# Patient Record
Sex: Female | Born: 2000 | Race: Black or African American | Hispanic: Yes | Marital: Single | Smoking: Never smoker
Health system: Southern US, Community
[De-identification: ages and names within clinical notes are randomized; demographics above are authoritative.]

## PROBLEM LIST (undated history)

## (undated) DIAGNOSIS — A749 Chlamydial infection, unspecified: Secondary | ICD-10-CM

## (undated) DIAGNOSIS — T50902A Poisoning by unspecified drugs, medicaments and biological substances, intentional self-harm, initial encounter: Secondary | ICD-10-CM

## (undated) HISTORY — DX: Chlamydial infection, unspecified: A74.9

## (undated) HISTORY — PX: NO PAST SURGERIES: SHX2092

---
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
## 2021-01-17 DIAGNOSIS — Z419 Encounter for procedure for purposes other than remedying health state, unspecified: Secondary | ICD-10-CM | POA: Diagnosis not present

## 2021-02-17 DIAGNOSIS — Z419 Encounter for procedure for purposes other than remedying health state, unspecified: Secondary | ICD-10-CM | POA: Diagnosis not present

## 2021-03-19 DIAGNOSIS — Z419 Encounter for procedure for purposes other than remedying health state, unspecified: Secondary | ICD-10-CM | POA: Diagnosis not present

## 2021-04-19 DIAGNOSIS — Z419 Encounter for procedure for purposes other than remedying health state, unspecified: Secondary | ICD-10-CM | POA: Diagnosis not present

## 2021-05-11 ENCOUNTER — Ambulatory Visit (INDEPENDENT_AMBULATORY_CARE_PROVIDER_SITE_OTHER): Payer: Medicaid Other | Admitting: Obstetrics and Gynecology

## 2021-05-11 ENCOUNTER — Encounter: Payer: Self-pay | Admitting: Obstetrics and Gynecology

## 2021-05-11 ENCOUNTER — Other Ambulatory Visit (HOSPITAL_COMMUNITY)
Admission: RE | Admit: 2021-05-11 | Discharge: 2021-05-11 | Disposition: A | Discharge status: Home / Self Care | Payer: Medicaid Other | Source: Ambulatory Visit | Attending: Obstetrics and Gynecology | Admitting: Obstetrics and Gynecology

## 2021-05-11 ENCOUNTER — Other Ambulatory Visit: Payer: Self-pay

## 2021-05-11 VITALS — BP 116/76 | HR 84 | Ht 65.0 in | Wt 119.2 lb

## 2021-05-11 DIAGNOSIS — Z3189 Encounter for other procreative management: Secondary | ICD-10-CM | POA: Diagnosis not present

## 2021-05-11 DIAGNOSIS — Z113 Encounter for screening for infections with a predominantly sexual mode of transmission: Secondary | ICD-10-CM | POA: Diagnosis not present

## 2021-05-11 MED ORDER — FOLIC ACID 1 MG PO TABS: 1.0000 mg | ORAL_TABLET | Freq: Every day | ORAL | 10 refills | Status: DC

## 2021-05-11 NOTE — Progress Notes (Signed)
Obstetrics and Gynecology Established Patient Evaluation  Appointment Date: 05/11/2021  OBGYN Clinic: [facility]  Primary Care Provider: Olean Ree B  Referring Provider: Self  Chief Complaint: Fertility questions  History of Present Illness: Hannah York is a 20 y.o. G0 (Patient's last menstrual period was 04/27/2021 (exact date).), seen for the above chief complaint.   Patient had Nexplanon removed in June 2021 and got a depo provera shot at the same time.  She has qmonth, regular periods that last a week and are not heavy or painful. She is interested in getting pregnant soon   Review of Systems: Pertinent items noted in HPI and remainder of comprehensive ROS otherwise negative.    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  Past Obstetrical History:  OB History  Gravida Para Term Preterm AB Living  0 0 0 0 0 0  SAB IAB Ectopic Multiple Live Births  0 0 0 0 0    Past Gynecological History: As per HPI. History of Pap Smear(s): No.  She is currently using no method for contraception.   Social History:  Social History   Socioeconomic History   Marital status: Single    Spouse name: Not on file   Number of children: Not on file   Years of education: Not on file   Highest education level: Not on file  Occupational History   Not on file  Tobacco Use   Smoking status: Never   Smokeless tobacco: Never  Substance and Sexual Activity   Alcohol use: Never   Drug use: Never   Sexual activity: Not Currently    Birth control/protection: Implant  Other Topics Concern   Not on file  Social History  Narrative   2020- student at [employer], wants to attend [employer] or [employer] to study nursing   Lives with her mother, mother's SO, younger brother and sister   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   Physical Exam:  BP 116/76   Pulse 84   Ht 5\' 5"  (1.651 m)   Wt 119 lb 3.2 oz (54.1 kg)   LMP 04/27/2021 (Exact Date)   BMI 19.84 kg/m  Body mass index is 19.84 kg/m. General appearance: Well nourished, well developed female in no acute distress.  Neuro/Psych:  Normal mood and affect.  Skin:  Warm and dry.   Pelvic exam: pt declined  Laboratory: none  Radiology: none  Assessment: pt doing well  Plan:  1. Screen for STD (sexually transmitted disease) Pt declines bloodwork - Cervicovaginal ancillary only  2. Encounter for fertility planning I told her that if she is  having periods then she is likely ovulating and since they are regular and qmonth, she can get pregnant. I told her I recommend taking folic acid (sent in) to decrease risk of NTD.    RTC PRN  Cornelia Copa MD Attending [facility])

## 2021-05-12 LAB — CERVICOVAGINAL ANCILLARY ONLY
Bacterial Vaginitis (gardnerella): POSITIVE — AB
Candida Glabrata: NEGATIVE
Candida Vaginitis: NEGATIVE
Chlamydia: NEGATIVE
Comment: NEGATIVE
Comment: NEGATIVE
Comment: NEGATIVE
Comment: NEGATIVE
Comment: NEGATIVE
Comment: NORMAL
Neisseria Gonorrhea: NEGATIVE
Trichomonas: NEGATIVE

## 2021-05-13 ENCOUNTER — Telehealth: Payer: Self-pay | Admitting: *Deleted

## 2021-05-13 MED ORDER — METRONIDAZOLE 500 MG PO TABS
500.0000 mg | ORAL_TABLET | Freq: Two times a day (BID) | ORAL | 0 refills | Status: AC
Start: 2021-05-13 — End: 2021-05-20

## 2021-05-13 NOTE — Telephone Encounter (Signed)
Pt informed of swab results and medication sent into the pharmacy.

## 2021-05-13 NOTE — Addendum Note (Signed)
Addended by: Donley Bing on: 05/13/2021 08:22 AM   Modules accepted: Orders

## 2021-05-13 NOTE — Telephone Encounter (Signed)
-----   Message from Plato Bing, MD sent at 05/13/2021  8:20 AM EDT ----- Can you let her know I sent in flagyl for her? thanks

## 2021-05-20 DIAGNOSIS — Z419 Encounter for procedure for purposes other than remedying health state, unspecified: Secondary | ICD-10-CM | POA: Diagnosis not present

## 2021-06-19 DIAGNOSIS — Z419 Encounter for procedure for purposes other than remedying health state, unspecified: Secondary | ICD-10-CM | POA: Diagnosis not present

## 2021-07-20 DIAGNOSIS — Z419 Encounter for procedure for purposes other than remedying health state, unspecified: Secondary | ICD-10-CM | POA: Diagnosis not present

## 2021-08-17 ENCOUNTER — Ambulatory Visit (INDEPENDENT_AMBULATORY_CARE_PROVIDER_SITE_OTHER): Payer: Medicaid Other

## 2021-08-17 ENCOUNTER — Other Ambulatory Visit: Payer: Self-pay

## 2021-08-17 ENCOUNTER — Ambulatory Visit (INDEPENDENT_AMBULATORY_CARE_PROVIDER_SITE_OTHER): Payer: Medicaid Other | Admitting: *Deleted

## 2021-08-17 VITALS — BP 123/82 | HR 73 | Wt 117.0 lb

## 2021-08-17 DIAGNOSIS — Z3A01 Less than 8 weeks gestation of pregnancy: Secondary | ICD-10-CM | POA: Diagnosis not present

## 2021-08-17 DIAGNOSIS — O3680X0 Pregnancy with inconclusive fetal viability, not applicable or unspecified: Secondary | ICD-10-CM

## 2021-08-17 DIAGNOSIS — Z3401 Encounter for supervision of normal first pregnancy, first trimester: Secondary | ICD-10-CM | POA: Diagnosis not present

## 2021-08-17 MED ORDER — DICLEGIS 10-10 MG PO TBEC: 2.0000 | DELAYED_RELEASE_TABLET | Freq: Every day | ORAL | 5 refills | Status: DC

## 2021-08-17 NOTE — Progress Notes (Signed)
Patient was assessed and managed by nursing staff during this encounter. I have reviewed the chart and agree with the documentation and plan.   Jaynie Collins, MD 08/17/2021 3:18 PM

## 2021-08-17 NOTE — Progress Notes (Signed)
New OB Intake  I explained I am completing New OB Intake today. We discussed her EDD of 03/30/2022 that is based on LMP of 06/23/2021. Pt is G1/P0. I reviewed her allergies, medications, Medical/Surgical/OB history, and appropriate screenings.   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   MyChart/Babyscripts MyChart access verified. I explained pt will have some visits in office and some virtually. Babyscripts instructions given and order placed. Patient verifies receipt of registration text/e-mail. Account successfully created and app downloaded.  Blood Pressure Cuff  Pt given BP cuff from office     Weight scale: Patient    have weight scale. Weight scale ordered for patient to pick up form [facility].   Anatomy US Explained first scheduled [place] will be around 19 weeks.   Labs Discussed Avelina Laine genetic screening with patient. Will think about both Panorama and Horizon. Routine prenatal labs needed.  Covid Vaccine Patient has covid vaccine.   Informed patient of [facility] website  and placed link in her AVS.   Send link to Pregnancy Navigators   Placed OB Box on problem list and updated   Patient informed that the ultrasound is considered a limited obstetric ultrasound and is not intended to be a complete ultrasound exam.  Patient also informed that the ultrasound is not being completed with the intent of assessing for fetal or placental anomalies or any pelvic abnormalities. Explained that the purpose of today's ultrasound is to assess for dating and fetal heart rate.  Patient acknowledges the purpose of the exam and the limitations of the study.      First visit review I reviewed new OB appt with pt. I  explained she will ob bloodwork with possible genetic screening. Explained pt will be seen by Thalia Bloodgood, CNM at first visit; encounter routed to appropriate provider. Explained that patient will be seen by pregnancy navigator following visit with provider.     Scheryl Marten, RN 08/17/2021  2:55 PM

## 2021-08-17 NOTE — Addendum Note (Signed)
Addended by: Scheryl Marten on: 08/17/2021 03:37 PM   Modules accepted: Orders

## 2021-08-19 DIAGNOSIS — Z419 Encounter for procedure for purposes other than remedying health state, unspecified: Secondary | ICD-10-CM | POA: Diagnosis not present

## 2021-09-16 ENCOUNTER — Ambulatory Visit (INDEPENDENT_AMBULATORY_CARE_PROVIDER_SITE_OTHER): Payer: Medicaid Other | Admitting: Advanced Practice Midwife

## 2021-09-16 ENCOUNTER — Other Ambulatory Visit: Payer: Self-pay

## 2021-09-16 ENCOUNTER — Other Ambulatory Visit (HOSPITAL_COMMUNITY)
Admission: RE | Admit: 2021-09-16 | Discharge: 2021-09-16 | Disposition: A | Discharge status: Home / Self Care | Payer: Medicaid Other | Source: Ambulatory Visit | Attending: Advanced Practice Midwife | Admitting: Advanced Practice Midwife

## 2021-09-16 ENCOUNTER — Encounter: Payer: Self-pay | Admitting: Advanced Practice Midwife

## 2021-09-16 VITALS — BP 117/79 | HR 103 | Wt 117.6 lb

## 2021-09-16 DIAGNOSIS — Z3481 Encounter for supervision of other normal pregnancy, first trimester: Secondary | ICD-10-CM | POA: Diagnosis not present

## 2021-09-16 DIAGNOSIS — Z23 Encounter for immunization: Secondary | ICD-10-CM

## 2021-09-16 DIAGNOSIS — F332 Major depressive disorder, recurrent severe without psychotic features: Secondary | ICD-10-CM

## 2021-09-16 DIAGNOSIS — O219 Vomiting of pregnancy, unspecified: Secondary | ICD-10-CM | POA: Diagnosis not present

## 2021-09-16 DIAGNOSIS — Z3490 Encounter for supervision of normal pregnancy, unspecified, unspecified trimester: Secondary | ICD-10-CM | POA: Insufficient documentation

## 2021-09-16 DIAGNOSIS — Z3A12 12 weeks gestation of pregnancy: Secondary | ICD-10-CM

## 2021-09-16 DIAGNOSIS — Z3401 Encounter for supervision of normal first pregnancy, first trimester: Secondary | ICD-10-CM

## 2021-09-16 MED ORDER — ONDANSETRON 4 MG PO TBDP: 4.0000 mg | ORAL_TABLET | Freq: Four times a day (QID) | ORAL | 0 refills | Status: DC | PRN

## 2021-09-16 MED ORDER — ASPIRIN EC 81 MG PO TBEC: 81.0000 mg | DELAYED_RELEASE_TABLET | Freq: Every day | ORAL | 2 refills | Status: DC

## 2021-09-16 MED ORDER — PROMETHAZINE HCL 12.5 MG PO TABS: 12.5000 mg | ORAL_TABLET | Freq: Four times a day (QID) | ORAL | 0 refills | Status: DC | PRN

## 2021-09-16 NOTE — Patient Instructions (Signed)

## 2021-09-16 NOTE — Progress Notes (Signed)
History:   Hannah York is a 20 y.o. G1P0000 at [redacted]w[redacted]d by early ultrasound being seen today for her first obstetrical visit.  Her obstetrical history is significant for  teen pregnancy . Patient does intend to breast feed. Pregnancy history fully reviewed.  Patient reports  nausea and vomiting . She has attempted management with Diclegis but is not experiencing relief.   Patient is accompanied by partner [facility]. They are both very excited.      HISTORY: OB History  Gravida Para Term Preterm AB Living  1 0 0 0 0 0  SAB IAB Ectopic Multiple Live Births  0 0 0 0 0    # Outcome Date GA Lbr Len/2nd Weight Sex Delivery Anes PTL Lv  1 Current             No pap history, age 64  Past Medical History:  Diagnosis Date   Chlamydia    No past surgical history on file. Family History  Problem Relation Age of Onset   Asthma Brother    Diabetes Maternal Grandmother    Hypertension Maternal Grandmother    Social History   Tobacco Use   Smoking status: Never   Smokeless tobacco: Never  Vaping Use   Vaping Use: Never used  Substance Use Topics   Alcohol use: Never   Drug use: Never   No Known Allergies Current Outpatient Medications on File Prior to Visit  Medication Sig Dispense Refill   DICLEGIS 10-10 MG TBEC Take 2 tablets by mouth at bedtime. If symptoms persist, add one tablet in the morning and one in the afternoon 100 tablet 5   folic acid (FOLVITE) 1 MG tablet Take 1 tablet (1 mg total) by mouth daily. 30 tablet 10   No current facility-administered medications on file prior to visit.   Indications for ASA therapy (per uptodate)  Two or more of the following: Nulliparity Yes Obesity (body mass index >30 kg/m2) No Family history of preeclampsia in mother or sister No Age ?35 years No Sociodemographic characteristics (African American race, low socioeconomic level) Yes Personal risk factors (eg, previous pregnancy with low birth weight or small for gestational  age infant, previous adverse pregnancy outcome [eg, stillbirth], interval >10 years between pregnancies) No   Review of Systems Pertinent items noted in HPI and remainder of comprehensive ROS otherwise negative.  Physical Exam:   Vitals:   09/16/21 1553  BP: 117/79  Pulse: (!) 103  Weight: 117 lb 9.6 oz (53.3 kg)   Fetal Heart Rate (bpm): 120  Uterine size:   Bedside Ultrasound for FHR check: Viable intrauterine pregnancy with positive cardiac activity noted, fetal heart rate 120 bpm Patient informed that the ultrasound is considered a limited obstetric ultrasound and is not intended to be a complete ultrasound exam.  Patient also informed that the ultrasound is not being completed with the intent of assessing for fetal or placental anomalies or any pelvic abnormalities.  Explained that the purpose of todays ultrasound is to assess for fetal heart rate.  Patient acknowledges the purpose of the exam and the limitations of the study. General: well-developed, well-nourished female in no acute distress  Skin: normal coloration and turgor, no rashes  Neurologic: oriented, normal, negative, normal mood  Extremities: normal strength, tone, and muscle mass, ROM of all joints is normal  HEENT PERRLA, extraocular movement intact and sclera clear, anicteric  Neck supple and no masses  Cardiovascular: regular rate and rhythm  Respiratory:  no respiratory distress, normal  effort  Abdomen: soft, non-tender; bowel sounds normal; no masses,  no organomegaly    Assessment:    Pregnancy: G1P0000 Patient Active Problem List   Diagnosis Date Noted   Supervision of normal pregnancy 09/16/2021   Major depressive disorder, recurrent severe without psychotic features (HCC) 09/22/2019   Intentional drug overdose (HCC)    Inattention 08/27/2019   Hyperactivity 08/27/2019     Plan:    1. Supervision of normal first teen pregnancy in first trimester - Welcomed to practice!  -  CBC/D/Plt+RPR+Rh+ABO+RubIgG... - Culture, OB Urine - Cervicovaginal ancillary only( [place]) - Genetic Screening  2. Nausea and vomiting during pregnancy prior to [redacted] weeks gestation  - Revised regimen to include Zofran and Phenergan as needed. Discussed previous research Zofran and cleft palate risk, no longer considered accurate but merits discussion with patient.  3. Flu vaccine need  - Flu Vaccine QUAD 64mo+IM (Fluarix, Fluzone & Alfiuria Quad PF)  4. Major depressive disorder, recurrent severe without psychotic features (HCC) - Stable, will discuss referral to [facility] next visit   Initial labs drawn. Continue prenatal vitamins. Problem list reviewed and updated. Genetic Screening discussed, First trimester screen, Quad screen, and NIPS: ordered. Ultrasound discussed; fetal anatomic survey: ordered. Anticipatory guidance about prenatal visits given including labs, ultrasounds, and testing. Discussed usage of Babyscripts and virtual visits as additional source of managing and completing prenatal visits in midst of coronavirus and pandemic.   Encouraged to complete MyChart Registration for her ability to review results, send requests, and have questions addressed.  The nature of [place] [facility] with multiple MDs and Advanced Practice Providers was explained to patient; also emphasized that residents, students are part of our team. Routine obstetric precautions reviewed. Encouraged to seek out care at office or emergency room [facility] MAU preferred) for urgent and/or emergent concerns. Return in about 4 weeks (around 10/14/2021) for APP please.    Clayton Bibles, MSA, MSN, CNM Certified Nurse Midwife, Biochemist, clinical for [employer], [facility]

## 2021-09-17 LAB — CBC/D/PLT+RPR+RH+ABO+RUBIGG...
Antibody Screen: NEGATIVE
Basophils Absolute: 0 10*3/uL (ref 0.0–0.2)
Basos: 0 %
EOS (ABSOLUTE): 0.1 10*3/uL (ref 0.0–0.4)
Eos: 1 %
HCV Ab: <0.1 s/co ratio (ref 0.0–0.9)
HIV Screen 4th Generation wRfx: NONREACTIVE
Hematocrit: 36.9 % (ref 34.0–46.6)
Hemoglobin: 12.5 g/dL (ref 11.1–15.9)
Hepatitis B Surface Ag: NEGATIVE
Immature Grans (Abs): 0 10*3/uL (ref 0.0–0.1)
Immature Granulocytes: 0 %
Lymphocytes Absolute: 2.1 10*3/uL (ref 0.7–3.1)
Lymphs: 24 %
MCH: 29.3 pg (ref 26.6–33.0)
MCHC: 33.9 g/dL (ref 31.5–35.7)
MCV: 87 fL (ref 79–97)
Monocytes Absolute: 0.7 10*3/uL (ref 0.1–0.9)
Monocytes: 8 %
Neutrophils Absolute: 5.8 10*3/uL (ref 1.4–7.0)
Neutrophils: 67 %
Platelets: 201 10*3/uL (ref 150–450)
RBC: 4.26 x10E6/uL (ref 3.77–5.28)
RDW: 12.7 % (ref 11.7–15.4)
RPR Ser Ql: NONREACTIVE
Rh Factor: POSITIVE
Rubella Antibodies, IGG: 1.09 index (ref >0.99)
WBC: 8.8 10*3/uL (ref 3.4–10.8)

## 2021-09-17 LAB — HCV INTERPRETATION

## 2021-09-18 LAB — URINE CULTURE, OB REFLEX

## 2021-09-18 LAB — CULTURE, OB URINE

## 2021-09-19 DIAGNOSIS — Z419 Encounter for procedure for purposes other than remedying health state, unspecified: Secondary | ICD-10-CM | POA: Diagnosis not present

## 2021-09-19 NOTE — L&D Delivery Note (Signed)
OB/GYN Faculty Practice Delivery Note  Hannah York is a 21 y.o. G1P0000 s/p SVD at 101w2d. She was admitted for spontaneous onset of labor.   ROM: 3h 24m with clear fluid GBS Status: Negative Maximum Maternal Temperature: 98.6  Labor Progress: Arrived to L&D at 4cm and progressed to 5cm for AROM. Progressed quickly to 8cm with urge to push, got epidural and labored easily to complete. Pushed for over an hour with slow progress so 33mu/min of pitocin started.   Delivery Date/Time: 03/25/22 at 2008 Delivery:  Head delivered LOP. No nuchal cord present. Shoulder and body delivered in usual fashion. Infant with spontaneous cry, placed on mother's abdomen, dried and stimulated. Cord clamped x 2 after 5-minute delay, and cut by FOB. Cord blood drawn and cord sample collected. Placenta delivered spontaneously, intact, with 3-vessel cord. Fundus firm with massage and Pitocin. Labia, perineum, vagina, and cervix inspected, no laceration found.  Placenta: Intact, spontaneous, sent to L&D for disposal Complications: None Lacerations: None EBL: 50 Analgesia: Epidural  Postpartum Planning [x]  transfer orders to MB [x]  discharge summary started & shared [x]  message to sent to schedule follow-up  [x]  lists updated [x]  vaccines UTD  Infant: Girl  APGARs 9/9  pending  , CNM, IBCLC Certified Nurse Midwife, [facility] for , [facility] 03/25/2022, 8:30 PM

## 2021-09-21 LAB — CERVICOVAGINAL ANCILLARY ONLY
Chlamydia: NEGATIVE
Comment: NEGATIVE
Comment: NORMAL
Neisseria Gonorrhea: NEGATIVE

## 2021-09-27 ENCOUNTER — Telehealth: Payer: Self-pay

## 2021-09-27 NOTE — Telephone Encounter (Signed)
TC to pt to make aware of both Genetic Screening results. Both Horizon/Panorama negative. Pt does not want to know gender of baby at this time.

## 2021-10-12 ENCOUNTER — Encounter: Payer: Self-pay | Admitting: Advanced Practice Midwife

## 2021-10-13 ENCOUNTER — Encounter: Payer: Self-pay | Admitting: *Deleted

## 2021-10-19 ENCOUNTER — Other Ambulatory Visit: Payer: Self-pay

## 2021-10-19 ENCOUNTER — Telehealth (INDEPENDENT_AMBULATORY_CARE_PROVIDER_SITE_OTHER): Payer: Medicaid Other | Admitting: Obstetrics & Gynecology

## 2021-10-19 ENCOUNTER — Encounter: Payer: Self-pay | Admitting: Obstetrics & Gynecology

## 2021-10-19 VITALS — BP 106/66 | HR 100

## 2021-10-19 DIAGNOSIS — Z3492 Encounter for supervision of normal pregnancy, unspecified, second trimester: Secondary | ICD-10-CM

## 2021-10-19 DIAGNOSIS — Z3A16 16 weeks gestation of pregnancy: Secondary | ICD-10-CM

## 2021-10-19 DIAGNOSIS — Z3490 Encounter for supervision of normal pregnancy, unspecified, unspecified trimester: Secondary | ICD-10-CM

## 2021-10-19 NOTE — Progress Notes (Signed)
° °  OBSTETRICS PRENATAL VIRTUAL VISIT ENCOUNTER NOTE  Provider location: Center for [place] [facility]   Patient location: Home  I connected with Hannah York on 10/19/21 at  3:30 PM EST by MyChart Video Encounter and verified that I am speaking with the correct person using two identifiers. I discussed the limitations, risks, security and privacy concerns of performing an evaluation and management service virtually and the availability of in person appointments. I also discussed with the patient that there may be a patient responsible charge related to this service. The patient expressed understanding and agreed to proceed. Subjective:  Hannah York is a 21 y.o. G1P0000 at [redacted]w[redacted]d being seen today for ongoing prenatal care.  She is currently monitored for the following issues for this low-risk pregnancy and has Inattention; Hyperactivity; Major depressive disorder, recurrent severe without psychotic features ([place]); Intentional drug overdose ([place]); and Supervision of normal pregnancy on their problem list.  Patient reports no complaints.  Contractions: Not present. Vag. Bleeding: None.   . Denies any leaking of fluid.   The following portions of the patient's history were reviewed and updated as appropriate: allergies, current medications, past family history, past medical history, past social history, past surgical history and problem list.   Objective:   Vitals:   10/19/21 1505  BP: 106/66  Pulse: 100    Fetal Status:           General:  Alert, oriented and cooperative. Patient is in no acute distress.  Respiratory: Normal respiratory effort, no problems with respiration noted  Mental Status: Normal mood and affect. Normal behavior. Normal judgment and thought content.  Rest of physical exam deferred due to type of encounter  Imaging: No results found.  Assessment and Plan:  Pregnancy: G1P0000 at [redacted]w[redacted]d 1. [redacted] weeks gestation of pregnancy 2. Encounter for  supervision of normal pregnancy, antepartum Low risk NIPS. Anatomy scan to be schedule. Work restrictions letter given to patient as per her request. - [place] MFM OB COMP + 63 WK; Future No other complaints or concerns.  Routine obstetric precautions reviewed.  I discussed the assessment and treatment plan with the patient. The patient was provided an opportunity to ask questions and all were answered. The patient agreed with the plan and demonstrated an understanding of the instructions. The patient was advised to call back or seek an in-person office evaluation/go to MAU at [facility] for any urgent or concerning symptoms. Please refer to After Visit Summary for other counseling recommendations.   I provided 10 minutes of face-to-face time during this encounter.  Return in about 23 days (around 11/11/2021) for OB visits as scheduled.  Future Appointments  Date Time Provider [place]  10/19/2021  3:30 PM Jennavecia Schwier, Sallyanne Havers, MD [facility]  11/11/2021  2:30 PM Darlina Rumpf, CNM [facility]    Verita Schneiders, MD Center for [facility] [place]

## 2021-10-20 DIAGNOSIS — Z419 Encounter for procedure for purposes other than remedying health state, unspecified: Secondary | ICD-10-CM | POA: Diagnosis not present

## 2021-11-11 ENCOUNTER — Other Ambulatory Visit: Payer: Self-pay

## 2021-11-11 ENCOUNTER — Ambulatory Visit (INDEPENDENT_AMBULATORY_CARE_PROVIDER_SITE_OTHER): Payer: Medicaid Other | Admitting: Advanced Practice Midwife

## 2021-11-11 VITALS — BP 112/75 | HR 76 | Wt 126.0 lb

## 2021-11-11 DIAGNOSIS — Z3401 Encounter for supervision of normal first pregnancy, first trimester: Secondary | ICD-10-CM

## 2021-11-11 DIAGNOSIS — Z3A20 20 weeks gestation of pregnancy: Secondary | ICD-10-CM

## 2021-11-11 NOTE — Progress Notes (Signed)
° °  PRENATAL VISIT NOTE  Subjective:  Hannah York is a 21 y.o. G1P0000 at [redacted]w[redacted]d being seen today for ongoing prenatal care.  She is currently monitored for the following issues for this low-risk pregnancy and has Inattention; Hyperactivity; Major depressive disorder, recurrent severe without psychotic features (HCC); Intentional drug overdose (HCC); and Supervision of normal pregnancy on their problem list.  Patient reports no complaints.  Contractions: Not present. Vag. Bleeding: None.  Movement: Present. Denies leaking of fluid.   The following portions of the patient's history were reviewed and updated as appropriate: allergies, current medications, past family history, past medical history, past social history, past surgical history and problem list. Problem list updated.  Objective:   Vitals:   11/11/21 1441  BP: 112/75  Pulse: 76  Weight: 126 lb (57.2 kg)    Fetal Status: Fetal Heart Rate (bpm): 143   Movement: Present     General:  Alert, oriented and cooperative. Patient is in no acute distress.  Skin: Skin is warm and dry. No rash noted.   Cardiovascular: Normal heart rate noted  Respiratory: Normal respiratory effort, no problems with respiration noted  Abdomen: Soft, gravid, appropriate for gestational age.  Pain/Pressure: Absent     Pelvic: Cervical exam deferred        Extremities: Normal range of motion.     Mental Status: Normal mood and affect. Normal behavior. Normal judgment and thought content.   Assessment and Plan:  Pregnancy: G1P0000 at [redacted]w[redacted]d  1. Supervision of normal first teen pregnancy in first trimester - Routine care, discussed typical physiologic changes in second trimester - AFP, Serum, Open Spina Bifida  2. [redacted] weeks gestation of pregnancy   Preterm labor symptoms and general obstetric precautions including but not limited to vaginal bleeding, contractions, leaking of fluid and fetal movement were reviewed in detail with the patient. Please  refer to After Visit Summary for other counseling recommendations.    Future Appointments  Date Time Provider Department Center  11/18/2021  1:00 PM [facility]  12/07/2021  2:30 PM Anyanwu, Jethro Bastos, MD [facility]  01/06/2022  8:35 AM Calvert Cantor, CNM [facility]    Calvert Cantor, CNM

## 2021-11-13 LAB — AFP, SERUM, OPEN SPINA BIFIDA
AFP MoM: 0.85
AFP Value: 63 ng/mL
Gest. Age on Collection Date: 20.1 weeks
Maternal Age At EDD: 20.1 yr
OSBR Risk 1 IN: 10000
Test Results:: NEGATIVE
Weight: 126 [lb_av]

## 2021-11-16 ENCOUNTER — Telehealth: Payer: Medicaid Other | Admitting: Obstetrics & Gynecology

## 2021-11-17 DIAGNOSIS — Z419 Encounter for procedure for purposes other than remedying health state, unspecified: Secondary | ICD-10-CM | POA: Diagnosis not present

## 2021-11-18 ENCOUNTER — Ambulatory Visit: Payer: Medicaid Other | Attending: Obstetrics

## 2021-11-18 ENCOUNTER — Other Ambulatory Visit: Payer: Self-pay

## 2021-11-18 DIAGNOSIS — Z363 Encounter for antenatal screening for malformations: Secondary | ICD-10-CM | POA: Insufficient documentation

## 2021-11-18 DIAGNOSIS — Z3A20 20 weeks gestation of pregnancy: Secondary | ICD-10-CM | POA: Insufficient documentation

## 2021-11-18 DIAGNOSIS — Z3A16 16 weeks gestation of pregnancy: Secondary | ICD-10-CM

## 2021-11-18 DIAGNOSIS — Z3490 Encounter for supervision of normal pregnancy, unspecified, unspecified trimester: Secondary | ICD-10-CM

## 2021-11-30 ENCOUNTER — Encounter: Payer: Self-pay | Admitting: Obstetrics & Gynecology

## 2021-12-01 ENCOUNTER — Encounter: Payer: Self-pay | Admitting: Advanced Practice Midwife

## 2021-12-07 ENCOUNTER — Other Ambulatory Visit: Payer: Self-pay

## 2021-12-07 ENCOUNTER — Ambulatory Visit (INDEPENDENT_AMBULATORY_CARE_PROVIDER_SITE_OTHER): Payer: Medicaid Other | Admitting: Obstetrics & Gynecology

## 2021-12-07 ENCOUNTER — Encounter: Payer: Self-pay | Admitting: Obstetrics & Gynecology

## 2021-12-07 VITALS — BP 112/74 | HR 93 | Wt 132.0 lb

## 2021-12-07 DIAGNOSIS — Z3402 Encounter for supervision of normal first pregnancy, second trimester: Secondary | ICD-10-CM

## 2021-12-07 DIAGNOSIS — M549 Dorsalgia, unspecified: Secondary | ICD-10-CM

## 2021-12-07 DIAGNOSIS — O99891 Other specified diseases and conditions complicating pregnancy: Secondary | ICD-10-CM

## 2021-12-07 DIAGNOSIS — Z3A23 23 weeks gestation of pregnancy: Secondary | ICD-10-CM

## 2021-12-07 NOTE — Progress Notes (Signed)
? ?  PRENATAL VISIT NOTE ? ?Subjective:  ?Hannah York is a 21 y.o. G1P0000 at [redacted]w[redacted]d being seen today for ongoing prenatal care.  She is currently monitored for the following issues for this low-risk pregnancy and has Inattention; Hyperactivity; Major depressive disorder, recurrent severe without psychotic features (HCC); Intentional drug overdose (HCC); and Supervision of normal pregnancy on their problem list. ? ?Patient reports  occasional low back pain .  Contractions: Not present. Vag. Bleeding: None.  Movement: Present. Denies leaking of fluid.  ? ?The following portions of the patient's history were reviewed and updated as appropriate: allergies, current medications, past family history, past medical history, past social history, past surgical history and problem list.  ? ?Objective:  ? ?Vitals:  ? 12/07/21 1437  ?BP: 112/74  ?Pulse: 93  ?Weight: 132 lb (59.9 kg)  ? ? ?Fetal Status: Fetal Heart Rate (bpm): 145 Fundal Height: 23 cm Movement: Present    ? ?General:  Alert, oriented and cooperative. Patient is in no acute distress.  ?Skin: Skin is warm and dry. No rash noted.   ?Cardiovascular: Normal heart rate noted  ?Respiratory: Normal respiratory effort, no problems with respiration noted  ?Abdomen: Soft, gravid, appropriate for gestational age.  Pain/Pressure: Absent     ?Pelvic: Cervical exam deferred        ?Extremities: Normal range of motion.  Edema: None  ?Mental Status: Normal mood and affect. Normal behavior. Normal judgment and thought content.  ? ?Assessment and Plan:  ?Pregnancy: G1P0000 at [redacted]w[redacted]d ?1. Back pain affecting pregnancy in second trimester ?Declines medications, doing exercises. Told to consider chiropractor services. ?Handout about exercises that can help given to patient. ?Work accommodations paperwork given to patient. ? ?2. [redacted] weeks gestation of pregnancy ?3. Encounter for supervision of normal first pregnancy in second trimester ?Preterm labor symptoms and general obstetric  precautions including but not limited to vaginal bleeding, contractions, leaking of fluid and fetal movement were reviewed in detail with the patient. ?Please refer to After Visit Summary for other counseling recommendations.  ? ?Return in about 4 weeks (around 01/04/2022) for 2 hr GTT, 3rd trimester labs, TDap, OFFICE OB VISIT (MD or APP). ? ?Future Appointments  ?Date Time Provider Department Center  ?01/06/2022  8:35 AM Calvert Cantor, CNM [facility]  ? ? ?Jaynie Collins, MD ? ?

## 2021-12-07 NOTE — Patient Instructions (Signed)
Nothing to eat or drink after midnight before your next appointment, just sips of plain water. ? ?TDaP Vaccine Pregnancy ?Get the Whooping Cough Vaccine While You Are Pregnant (CDC) ? ?It is important for women to get the whooping cough vaccine in the third trimester of each pregnancy. Vaccines are the best way to prevent this disease. There are 2 different whooping cough vaccines. Both vaccines combine protection against whooping cough, tetanus and diphtheria, but they are for different age groups: ?Tdap: for everyone 11 years or older, including pregnant women  ?DTaP: for children 2 months through 67 years of age ? ?You need the whooping cough vaccine during each of your pregnancies ?The recommended time to get the shot is during your 27th through 36th week of pregnancy, preferably during the earlier part of this time period. ?The Centers for Disease Control and Prevention (CDC) recommends that pregnant women receive the whooping cough vaccine for adolescents and adults (called Tdap vaccine) during the third trimester of each pregnancy. The recommended time to get the shot is during your 27th through 36th week of pregnancy, preferably during the earlier part of this time period. This replaces the original recommendation that pregnant women get the vaccine only if they had not previously received it. ?The [employer] of Obstetricians and Gynecologists and the [employer] support this recommendation. ? ?You should get the whooping cough vaccine while pregnant to pass protection to your baby ?frame support disabled and/or not supported in this browser  ?Learn why Mickel Baas decided to get the whooping cough vaccine in her 3rd trimester of pregnancy and how her baby girl was born with some protection against the disease. ?Also available on YouTube. ?After receiving the whooping cough vaccine, your body will create protective antibodies (proteins produced by the body to fight off diseases) and  pass some of them to your baby before birth. These antibodies provide your baby some short-term protection against whooping cough in early life. These antibodies can also protect your baby from some of the more serious complications that come along with whooping cough. ?Your protective antibodies are at their highest about 2 weeks after getting the vaccine, but it takes time to pass them to your baby. So the preferred time to get the whooping cough vaccine is early in your third trimester. ?The amount of whooping cough antibodies in your body decreases over time. That is why CDC recommends you get a whooping cough vaccine during each pregnancy. Doing so allows each of your babies to get the greatest number of protective antibodies from you. This means each of your babies will get the best protection possible against this disease. ? ?Getting the whooping cough vaccine while pregnant is better than getting the vaccine after you give birth ?Whooping cough vaccination during pregnancy is ideal so your baby will have short-term protection as soon as he is born. This early protection is important because your baby will not start getting his whooping cough vaccines until he is 2 months old. These first few months of life are when your baby is at greatest risk for catching whooping cough. This is also when he?s at greatest risk for having severe, potentially life-threating complications from the infection. To avoid that gap in protection, it is best to get a whooping cough vaccine during pregnancy. You will then pass protection to your baby before he is born. To continue protecting your baby, he should get whooping cough vaccines starting at 2 months old. ?You may never have gotten the  Tdap vaccine before and did not get it during this pregnancy. If so, you should make sure to get the vaccine immediately after you give birth, before leaving the hospital or birthing center. It will take about 2 weeks before your body  develops protection (antibodies) in response to the vaccine. Once you have protection from the vaccine, you are less likely to give whooping cough to your newborn while caring for him. But remember, your baby will still be at risk for catching whooping cough from others. ?A recent study looked to see how effective Tdap was at preventing whooping cough in babies whose mothers got the vaccine while pregnant or in the hospital after giving birth. The study found that getting Tdap between 27 through 36 weeks of pregnancy is 85% more effective at preventing whooping cough in babies younger than 2 months old. ?Blood tests cannot tell if you need a whooping cough vaccine ?There are no blood tests that can tell you if you have enough antibodies in your body to protect yourself or your baby against whooping cough. Even if you have been sick with whooping cough in the past or previously received the vaccine, you still should get the vaccine during each pregnancy. ?Breastfeeding may pass some protective antibodies onto your baby ?By breastfeeding, you may pass some antibodies you have made in response to the vaccine to your baby. When you get a whooping cough vaccine during your pregnancy, you will have antibodies in your breast milk that you can share with your baby as soon as your milk comes in. However, your baby will not get protective antibodies immediately if you wait to get the whooping cough vaccine until after delivering your baby. This is because it takes about 2 weeks for your body to create antibodies. Learn more about the health benefits of breastfeeding. ? ?

## 2021-12-18 DIAGNOSIS — Z419 Encounter for procedure for purposes other than remedying health state, unspecified: Secondary | ICD-10-CM | POA: Diagnosis not present

## 2022-01-06 ENCOUNTER — Encounter (INDEPENDENT_AMBULATORY_CARE_PROVIDER_SITE_OTHER): Payer: Self-pay | Admitting: *Deleted

## 2022-01-06 ENCOUNTER — Ambulatory Visit (INDEPENDENT_AMBULATORY_CARE_PROVIDER_SITE_OTHER): Payer: Medicaid Other | Admitting: Advanced Practice Midwife

## 2022-01-06 VITALS — BP 117/77 | HR 79 | Wt 137.0 lb

## 2022-01-06 DIAGNOSIS — Z3403 Encounter for supervision of normal first pregnancy, third trimester: Secondary | ICD-10-CM

## 2022-01-06 DIAGNOSIS — Z3A28 28 weeks gestation of pregnancy: Secondary | ICD-10-CM

## 2022-01-06 DIAGNOSIS — Z23 Encounter for immunization: Secondary | ICD-10-CM

## 2022-01-06 DIAGNOSIS — Z3402 Encounter for supervision of normal first pregnancy, second trimester: Secondary | ICD-10-CM | POA: Diagnosis not present

## 2022-01-06 LAB — CBC
Hematocrit: 31.8 % — ABNORMAL LOW (ref 34.0–46.6)
Hemoglobin: 10.6 g/dL — ABNORMAL LOW (ref 11.1–15.9)
MCH: 29 pg (ref 26.6–33.0)
MCHC: 33.3 g/dL (ref 31.5–35.7)
MCV: 87 fL (ref 79–97)
Platelets: 210 10*3/uL (ref 150–450)
RBC: 3.65 x10E6/uL — ABNORMAL LOW (ref 3.77–5.28)
RDW: 12 % (ref 11.7–15.4)
WBC: 11.7 10*3/uL — ABNORMAL HIGH (ref 3.4–10.8)

## 2022-01-06 NOTE — Progress Notes (Addendum)
? ?  PRENATAL VISIT NOTE ? ?Subjective:  ?Hannah York is a 21 y.o. G1P0000 at [redacted]w[redacted]d being seen today for ongoing prenatal care.  She is currently monitored for the following issues for this low-risk pregnancy and has Inattention; Hyperactivity; Major depressive disorder, recurrent severe without psychotic features (HCC); Intentional drug overdose (HCC); and Supervision of normal pregnancy on their problem list. ? ?Patient reports no complaints.  Contractions: Not present. Vag. Bleeding: None.  Movement: Present. Denies leaking of fluid.  ? ?The following portions of the patient's history were reviewed and updated as appropriate: allergies, current medications, past family history, past medical history, past social history, past surgical history and problem list. Problem list updated. ? ?Objective:  ? ?Vitals:  ? 01/06/22 0830  ?BP: 117/77  ?Pulse: 79  ?Weight: 137 lb (62.1 kg)  ? ? ?Fetal Status: Fetal Heart Rate (bpm): 142 Fundal Height: 28 cm Movement: Present    ? ?General:  Alert, oriented and cooperative. Patient is in no acute distress.  ?Skin: Skin is warm and dry. No rash noted.   ?Cardiovascular: Normal heart rate noted  ?Respiratory: Normal respiratory effort, no problems with respiration noted  ?Abdomen: Soft, gravid, appropriate for gestational age.  Pain/Pressure: Absent     ?Pelvic: Cervical exam deferred        ?Extremities: Normal range of motion.  Edema: None  ?Mental Status: Normal mood and affect. Normal behavior. Normal judgment and thought content.  ? ?Assessment and Plan:  ?Pregnancy: G1P0000 at [redacted]w[redacted]d ? ?1. Supervision of normal first teen pregnancy in third trimester ?- Routine care ?- Discussed plan for unmedicated birth, relationship between laboring person and labor team to assess coping ?- Reviewed guidelines for daily kick counts, interventions for low kick number, indications for evaluation in MAU ?- Glucose Tolerance, 2 Hours w/1 Hour ?- CBC ?- RPR ?- HIV Antibody (routine testing w  rflx) ? ?2. [redacted] weeks gestation of pregnancy ? ? ?Preterm labor symptoms and general obstetric precautions including but not limited to vaginal bleeding, contractions, leaking of fluid and fetal movement were reviewed in detail with the patient. ?Please refer to After Visit Summary for other counseling recommendations.  ?Return in about 2 weeks (around 01/20/2022) for MD or APP please. ? ?Future Appointments  ?Date Time Provider Department Center  ?01/20/2022  1:50 PM Calvert Cantor, CNM [facility]  ?02/03/2022  1:30 PM Calvert Cantor, CNM [facility]  ?02/17/2022  2:30 PM Calvert Cantor, CNM [facility]  ?03/03/2022 11:15 AM Calvert Cantor, CNM [facility]  ?03/17/2022  3:30 PM Calvert Cantor, CNM [facility]  ? ? ?Calvert Cantor, CNM ? ?

## 2022-01-07 ENCOUNTER — Encounter: Payer: Self-pay | Admitting: Advanced Practice Midwife

## 2022-01-07 LAB — GLUCOSE TOLERANCE, 2 HOURS W/ 1HR
Glucose, 1 hour: 126 mg/dL (ref 70–179)
Glucose, 2 hour: 103 mg/dL (ref 70–152)
Glucose, Fasting: 75 mg/dL (ref 70–91)

## 2022-01-07 LAB — RPR: RPR Ser Ql: NONREACTIVE

## 2022-01-07 LAB — HIV ANTIBODY (ROUTINE TESTING W REFLEX): HIV Screen 4th Generation wRfx: NONREACTIVE

## 2022-01-10 ENCOUNTER — Other Ambulatory Visit: Payer: Self-pay | Admitting: *Deleted

## 2022-01-10 MED ORDER — FERROUS SULFATE 325 (65 FE) MG PO TABS: 325.0000 mg | ORAL_TABLET | Freq: Two times a day (BID) | ORAL | 3 refills | Status: DC

## 2022-01-17 DIAGNOSIS — Z419 Encounter for procedure for purposes other than remedying health state, unspecified: Secondary | ICD-10-CM | POA: Diagnosis not present

## 2022-01-20 ENCOUNTER — Encounter: Payer: Self-pay | Admitting: Advanced Practice Midwife

## 2022-01-20 ENCOUNTER — Ambulatory Visit (INDEPENDENT_AMBULATORY_CARE_PROVIDER_SITE_OTHER): Payer: Medicaid Other | Admitting: Advanced Practice Midwife

## 2022-01-20 VITALS — BP 116/75 | HR 81 | Wt 139.0 lb

## 2022-01-20 DIAGNOSIS — Z3A30 30 weeks gestation of pregnancy: Secondary | ICD-10-CM

## 2022-01-20 DIAGNOSIS — Z3403 Encounter for supervision of normal first pregnancy, third trimester: Secondary | ICD-10-CM

## 2022-01-20 NOTE — Progress Notes (Signed)
ROB [redacted]w[redacted]d ? ?CC: NONE  ? ? ?

## 2022-01-20 NOTE — Progress Notes (Signed)
? ?  PRENATAL VISIT NOTE ? ?Subjective:  ?Hannah York is a 21 y.o. G1P0000 at [redacted]w[redacted]d being seen today for ongoing prenatal care.  She is currently monitored for the following issues for this low-risk pregnancy and has Inattention; Hyperactivity; Major depressive disorder, recurrent severe without psychotic features ([place]); Intentional drug overdose ([place]); and Supervision of normal pregnancy on their problem list. ? ?Patient reports no complaints.  Contractions: Not present. Vag. Bleeding: None.  Movement: Present. Denies leaking of fluid.  ? ?The following portions of the patient's history were reviewed and updated as appropriate: allergies, current medications, past family history, past medical history, past social history, past surgical history and problem list. Problem list updated. ? ?Objective:  ? ?Vitals:  ? 01/20/22 1411  ?BP: 116/75  ?Pulse: 81  ?Weight: 139 lb (63 kg)  ? ? ?Fetal Status: Fetal Heart Rate (bpm): 140 Fundal Height: 30 cm Movement: Present    ? ?General:  Alert, oriented and cooperative. Patient is in no acute distress.  ?Skin: Skin is warm and dry. No rash noted.   ?Cardiovascular: Normal heart rate noted  ?Respiratory: Normal respiratory effort, no problems with respiration noted  ?Abdomen: Soft, gravid, appropriate for gestational age.  Pain/Pressure: Absent     ?Pelvic: Cervical exam deferred        ?Extremities: Normal range of motion.  Edema: None  ?Mental Status: Normal mood and affect. Normal behavior. Normal judgment and thought content.  ? ?Assessment and Plan:  ?Pregnancy: G1P0000 at [redacted]w[redacted]d ? ?1. Supervision of normal first teen pregnancy in third trimester ?- LOB, routine care ? ?2. [redacted] weeks gestation of pregnancy ?- Reviewed previous discussion of daily kick counts ? ?Preterm labor symptoms and general obstetric precautions including but not limited to vaginal bleeding, contractions, leaking of fluid and fetal movement were reviewed in detail with the patient. ?Please refer to  After Visit Summary for other counseling recommendations.  ?Return in about 2 weeks (around 02/03/2022). ? ?Future Appointments  ?Date Time Provider [place]  ?02/03/2022  1:30 PM Darlina Rumpf, CNM [facility]  ?02/17/2022  2:30 PM Darlina Rumpf, CNM [facility]  ?03/03/2022 11:15 AM Darlina Rumpf, CNM [facility]  ?03/17/2022  3:30 PM Darlina Rumpf, CNM [facility]  ? ? ?Darlina Rumpf, CNM ? ?

## 2022-01-28 ENCOUNTER — Encounter: Payer: Self-pay | Admitting: Advanced Practice Midwife

## 2022-02-03 ENCOUNTER — Ambulatory Visit (INDEPENDENT_AMBULATORY_CARE_PROVIDER_SITE_OTHER): Payer: Medicaid Other | Admitting: Advanced Practice Midwife

## 2022-02-03 VITALS — BP 111/75 | HR 82 | Wt 142.0 lb

## 2022-02-03 DIAGNOSIS — Z3403 Encounter for supervision of normal first pregnancy, third trimester: Secondary | ICD-10-CM

## 2022-02-03 DIAGNOSIS — Z3A32 32 weeks gestation of pregnancy: Secondary | ICD-10-CM

## 2022-02-03 DIAGNOSIS — D509 Iron deficiency anemia, unspecified: Secondary | ICD-10-CM

## 2022-02-03 DIAGNOSIS — O99019 Anemia complicating pregnancy, unspecified trimester: Secondary | ICD-10-CM

## 2022-02-03 NOTE — Progress Notes (Signed)
   PRENATAL VISIT NOTE  Subjective:  Hannah York is a 21 y.o. G1P0000 at [redacted]w[redacted]d being seen today for ongoing prenatal care.  She is currently monitored for the following issues for this low-risk pregnancy and has Inattention; Hyperactivity; Major depressive disorder, recurrent severe without psychotic features (HCC); Intentional drug overdose (HCC); and Supervision of normal pregnancy on their problem list.  Patient reports no complaints.  Contractions: Not present. Vag. Bleeding: None.  Movement: Present. Denies leaking of fluid.   The following portions of the patient's history were reviewed and updated as appropriate: allergies, current medications, past family history, past medical history, past social history, past surgical history and problem list. Problem list updated.  Objective:   Vitals:   02/03/22 1343  BP: 111/75  Pulse: 82  Weight: 142 lb (64.4 kg)    Fetal Status: Fetal Heart Rate (bpm): 140 Fundal Height: 32 cm Movement: Present     General:  Alert, oriented and cooperative. Patient is in no acute distress.  Skin: Skin is warm and dry. No rash noted.   Cardiovascular: Normal heart rate noted  Respiratory: Normal respiratory effort, no problems with respiration noted  Abdomen: Soft, gravid, appropriate for gestational age.  Pain/Pressure: Absent     Pelvic: Cervical exam deferred        Extremities: Normal range of motion.  Edema: None  Mental Status: Normal mood and affect. Normal behavior. Normal judgment and thought content.   Assessment and Plan:  Pregnancy: G1P0000 at [redacted]w[redacted]d  1. Supervision of normal first teen pregnancy in third trimester - LOB, routine care - Happy Birthday!  2. Iron deficiency anemia of mother during pregnancy - Hgb 10.6 on 01/06/22, rx'd PO Fe - Asymptomatic  3. [redacted] weeks gestation of pregnancy   Preterm labor symptoms and general obstetric precautions including but not limited to vaginal bleeding, contractions, leaking of fluid  and fetal movement were reviewed in detail with the patient. Please refer to After Visit Summary for other counseling recommendations.    Future Appointments  Date Time Provider Department Center  02/17/2022  2:30 PM Calvert Cantor, [place] [facility]  03/03/2022 11:15 AM Calvert Cantor, CNM [facility]  03/17/2022  3:30 PM Calvert Cantor, CNM [facility]    Calvert Cantor, [place]

## 2022-02-17 ENCOUNTER — Ambulatory Visit (INDEPENDENT_AMBULATORY_CARE_PROVIDER_SITE_OTHER): Payer: Medicaid Other | Admitting: Advanced Practice Midwife

## 2022-02-17 ENCOUNTER — Encounter: Payer: Self-pay | Admitting: Advanced Practice Midwife

## 2022-02-17 VITALS — BP 118/75 | HR 83 | Wt 144.0 lb

## 2022-02-17 DIAGNOSIS — F332 Major depressive disorder, recurrent severe without psychotic features: Secondary | ICD-10-CM

## 2022-02-17 DIAGNOSIS — Z3A34 34 weeks gestation of pregnancy: Secondary | ICD-10-CM

## 2022-02-17 DIAGNOSIS — D509 Iron deficiency anemia, unspecified: Secondary | ICD-10-CM

## 2022-02-17 DIAGNOSIS — O99019 Anemia complicating pregnancy, unspecified trimester: Secondary | ICD-10-CM

## 2022-02-17 DIAGNOSIS — Z3490 Encounter for supervision of normal pregnancy, unspecified, unspecified trimester: Secondary | ICD-10-CM

## 2022-02-17 DIAGNOSIS — Z419 Encounter for procedure for purposes other than remedying health state, unspecified: Secondary | ICD-10-CM | POA: Diagnosis not present

## 2022-02-17 NOTE — Progress Notes (Signed)
ROB [redacted]w[redacted]d  Patient wants to discuss delivery process.

## 2022-02-18 NOTE — Progress Notes (Signed)
   PRENATAL VISIT NOTE  Subjective:  Hannah York is a 21 y.o. G1P0000 at [redacted]w[redacted]d being seen today for ongoing prenatal care.  She is currently monitored for the following issues for this low-risk pregnancy and has Inattention; Hyperactivity; Major depressive disorder, recurrent severe without psychotic features (HCC); Intentional drug overdose (HCC); and Supervision of normal pregnancy on their problem list.  Patient reports no complaints.  Contractions: Not present. Vag. Bleeding: None.  Movement: Present. Denies leaking of fluid.   The following portions of the patient's history were reviewed and updated as appropriate: allergies, current medications, past family history, past medical history, past social history, past surgical history and problem list. Problem list updated.  Objective:   Vitals:   02/17/22 1448  BP: 118/75  Pulse: 83  Weight: 144 lb (65.3 kg)    Fetal Status: Fetal Heart Rate (bpm): 138 Fundal Height: 34 cm Movement: Present     General:  Alert, oriented and cooperative. Patient is in no acute distress.  Skin: Skin is warm and dry. No rash noted.   Cardiovascular: Normal heart rate noted  Respiratory: Normal respiratory effort, no problems with respiration noted  Abdomen: Soft, gravid, appropriate for gestational age.  Pain/Pressure: Present     Pelvic: Cervical exam deferred        Extremities: Normal range of motion.  Edema: None  Mental Status: Normal mood and affect. Normal behavior. Normal judgment and thought content.   Assessment and Plan:  Pregnancy: G1P0000 at [redacted]w[redacted]d  1. Encounter for supervision of normal pregnancy, antepartum, unspecified gravidity - LOB, routine care, doing so well!! - GBS swab next visit. Discussed impact on labor course, different options for GBS treatment depending on labor course  2. [redacted] weeks gestation of pregnancy   3. Iron deficiency anemia of mother during pregnancy - Switched from prenatal gummy to PNV with Fe -  Asymptomatic - Hgb 10.6 01/06/2022  4. Major depressive disorder, recurrent severe without psychotic features (HCC) - Preemptive discussion: standard order for SW consult when recovering on postpartum  Preterm labor symptoms and general obstetric precautions including but not limited to vaginal bleeding, contractions, leaking of fluid and fetal movement were reviewed in detail with the patient. Please refer to After Visit Summary for other counseling recommendations.    Future Appointments  Date Time Provider Department Center  03/03/2022 11:15 AM Calvert Cantor, CNM [facility]  03/17/2022  3:30 PM Calvert Cantor, CNM [facility]  03/23/2022  1:30 PM Reva Bores, MD [facility]  03/31/2022  2:30 PM Calvert Cantor, CNM [facility]    Calvert Cantor, CNM

## 2022-03-03 ENCOUNTER — Ambulatory Visit (INDEPENDENT_AMBULATORY_CARE_PROVIDER_SITE_OTHER): Payer: Medicaid Other | Admitting: Advanced Practice Midwife

## 2022-03-03 ENCOUNTER — Other Ambulatory Visit (HOSPITAL_COMMUNITY)
Admission: RE | Admit: 2022-03-03 | Discharge: 2022-03-03 | Disposition: A | Discharge status: Home / Self Care | Payer: Medicaid Other | Source: Ambulatory Visit | Attending: Advanced Practice Midwife | Admitting: Advanced Practice Midwife

## 2022-03-03 VITALS — BP 114/76 | HR 89 | Wt 148.0 lb

## 2022-03-03 DIAGNOSIS — Z3490 Encounter for supervision of normal pregnancy, unspecified, unspecified trimester: Secondary | ICD-10-CM | POA: Insufficient documentation

## 2022-03-03 DIAGNOSIS — Z3A36 36 weeks gestation of pregnancy: Secondary | ICD-10-CM

## 2022-03-03 DIAGNOSIS — D509 Iron deficiency anemia, unspecified: Secondary | ICD-10-CM

## 2022-03-03 DIAGNOSIS — O99019 Anemia complicating pregnancy, unspecified trimester: Secondary | ICD-10-CM

## 2022-03-03 NOTE — Progress Notes (Signed)
   PRENATAL VISIT NOTE  Subjective:  ALAJA York is a 21 y.o. G1P0000 at [redacted]w[redacted]d being seen today for ongoing prenatal care.  She is currently monitored for the following issues for this low-risk pregnancy and has Inattention; Hyperactivity; Major depressive disorder, recurrent severe without psychotic features (HCC); Intentional drug overdose (HCC); and Supervision of normal pregnancy on their problem list.  Patient reports no complaints.  Contractions: Not present. Vag. Bleeding: None.  Movement: Present. Denies leaking of fluid.   The following portions of the patient's history were reviewed and updated as appropriate: allergies, current medications, past family history, past medical history, past social history, past surgical history and problem list. Problem list updated.  Objective:   Vitals:   03/03/22 1112  BP: 114/76  Pulse: 89  Weight: 148 lb (67.1 kg)    Fetal Status: Fetal Heart Rate (bpm): 138 Fundal Height: 36 cm Movement: Present     General:  Alert, oriented and cooperative. Patient is in no acute distress.  Skin: Skin is warm and dry. No rash noted.   Cardiovascular: Normal heart rate noted  Respiratory: Normal respiratory effort, no problems with respiration noted  Abdomen: Soft, gravid, appropriate for gestational age.  Pain/Pressure: Absent     Pelvic: Cervical exam deferred        Extremities: Normal range of motion.  Edema: None  Mental Status: Normal mood and affect. Normal behavior. Normal judgment and thought content.   Assessment and Plan:  Pregnancy: G1P0000 at [redacted]w[redacted]d  1. Encounter for supervision of normal pregnancy, antepartum, unspecified gravidity - Routine care, no acute concerns - Reviewed typical signs of early labor, indications for evaluation in MAU - Reviewed indication for GBS screening, impact on labor if positive - Cervicovaginal ancillary only - Strep Gp B NAA  2. Iron deficiency anemia of mother during pregnancy - Stable,  asymptomatic, on oral FE  3. [redacted] weeks gestation of pregnancy   Preterm labor symptoms and general obstetric precautions including but not limited to vaginal bleeding, contractions, leaking of fluid and fetal movement were reviewed in detail with the patient. Please refer to After Visit Summary for other counseling recommendations.  Return in about 1 week (around 03/10/2022) for MD or APP.  Future Appointments  Date Time Provider Department Center  03/10/2022 11:15 AM Kings Grant Bing, MD [facility]  03/17/2022  3:30 PM Calvert Cantor, CNM [facility]  03/23/2022  1:30 PM Reva Bores, MD [facility]  03/31/2022  2:30 PM Calvert Cantor, CNM [facility]    Calvert Cantor, CNM

## 2022-03-04 LAB — CERVICOVAGINAL ANCILLARY ONLY
Chlamydia: NEGATIVE
Comment: NEGATIVE
Comment: NORMAL
Neisseria Gonorrhea: NEGATIVE

## 2022-03-05 LAB — STREP GP B NAA: Strep Gp B NAA: NEGATIVE

## 2022-03-10 ENCOUNTER — Ambulatory Visit (INDEPENDENT_AMBULATORY_CARE_PROVIDER_SITE_OTHER): Payer: Medicaid Other | Admitting: Obstetrics and Gynecology

## 2022-03-10 VITALS — BP 122/81 | HR 82 | Wt 153.0 lb

## 2022-03-10 DIAGNOSIS — Z3A37 37 weeks gestation of pregnancy: Secondary | ICD-10-CM

## 2022-03-10 DIAGNOSIS — Z3483 Encounter for supervision of other normal pregnancy, third trimester: Secondary | ICD-10-CM

## 2022-03-11 ENCOUNTER — Encounter: Payer: Self-pay | Admitting: Advanced Practice Midwife

## 2022-03-11 DIAGNOSIS — Z3490 Encounter for supervision of normal pregnancy, unspecified, unspecified trimester: Secondary | ICD-10-CM

## 2022-03-17 ENCOUNTER — Ambulatory Visit (INDEPENDENT_AMBULATORY_CARE_PROVIDER_SITE_OTHER): Payer: Medicaid Other | Admitting: Advanced Practice Midwife

## 2022-03-17 VITALS — BP 120/82 | HR 87 | Wt 149.0 lb

## 2022-03-17 DIAGNOSIS — Z3490 Encounter for supervision of normal pregnancy, unspecified, unspecified trimester: Secondary | ICD-10-CM

## 2022-03-17 DIAGNOSIS — Z3A38 38 weeks gestation of pregnancy: Secondary | ICD-10-CM

## 2022-03-17 NOTE — Progress Notes (Signed)
ROB [redacted]w[redacted]d  Pt would like cervix check.

## 2022-03-18 NOTE — Progress Notes (Signed)
   PRENATAL VISIT NOTE  Subjective:  Hannah York is a 21 y.o. G1P0000 at [redacted]w[redacted]d being seen today for ongoing prenatal care.  She is currently monitored for the following issues for this low-risk pregnancy and has Inattention; Hyperactivity; Major depressive disorder, recurrent severe without psychotic features (HCC); Intentional drug overdose (HCC); and Supervision of normal pregnancy on their problem list.  Patient reports occasional contractions.  Contractions: Not present. Vag. Bleeding: None.  Movement: Present. Denies leaking of fluid.   The following portions of the patient's history were reviewed and updated as appropriate: allergies, current medications, past family history, past medical history, past social history, past surgical history and problem list. Problem list updated.  Objective:   Vitals:   03/17/22 1558  BP: 120/82  Pulse: 87  Weight: 149 lb (67.6 kg)    Fetal Status: Fetal Heart Rate (bpm): 144   Movement: Present  Presentation: Vertex  General:  Alert, oriented and cooperative. Patient is in no acute distress.  Skin: Skin is warm and dry. No rash noted.   Cardiovascular: Normal heart rate noted  Respiratory: Normal respiratory effort, no problems with respiration noted  Abdomen: Soft, gravid, appropriate for gestational age.  Pain/Pressure: Present     Pelvic: Cervical exam performed per patient request:  Dilation: 2.5 Effacement (%): 70 Station: -3  Extremities: Normal range of motion.  Edema: None  Mental Status: Normal mood and affect. Normal behavior. Normal judgment and thought content.   Assessment and Plan:  Pregnancy: G1P0000 at [redacted]w[redacted]d  1. Encounter for supervision of normal pregnancy, antepartum, unspecified gravidity - LROB, routine care - Indications for evaluation in [place] reviewed - Declines scheduled IOL today  2. [redacted] weeks gestation of pregnancy   Term labor symptoms and general obstetric precautions including but not limited to vaginal  bleeding, contractions, leaking of fluid and fetal movement were reviewed in detail with the patient. Please refer to After Visit Summary for other counseling recommendations.  No follow-ups on file.  Future Appointments  Date Time Provider Department Center  03/23/2022  1:30 PM Reva Bores, MD [facility]  03/31/2022  2:30 PM Calvert Cantor, CNM [facility]    Calvert Cantor, [place]

## 2022-03-19 DIAGNOSIS — Z419 Encounter for procedure for purposes other than remedying health state, unspecified: Secondary | ICD-10-CM | POA: Diagnosis not present

## 2022-03-23 ENCOUNTER — Ambulatory Visit (INDEPENDENT_AMBULATORY_CARE_PROVIDER_SITE_OTHER): Payer: Medicaid Other | Admitting: Family Medicine

## 2022-03-23 VITALS — BP 119/75 | HR 77 | Wt 154.0 lb

## 2022-03-23 DIAGNOSIS — Z3403 Encounter for supervision of normal first pregnancy, third trimester: Secondary | ICD-10-CM

## 2022-03-23 NOTE — Progress Notes (Signed)
   PRENATAL VISIT NOTE  Subjective:  Hannah York is a 21 y.o. G1P0000 at [redacted]w[redacted]d being seen today for ongoing prenatal care.  She is currently monitored for the following issues for this low-risk pregnancy and has Inattention; Hyperactivity; Major depressive disorder, recurrent severe without psychotic features (HCC); Intentional drug overdose (HCC); and Supervision of normal pregnancy on their problem list.  Patient reports no complaints.  Contractions: Not present. Vag. Bleeding: None.  Movement: Present. Denies leaking of fluid.   The following portions of the patient's history were reviewed and updated as appropriate: allergies, current medications, past family history, past medical history, past social history, past surgical history and problem list.   Objective:   Vitals:   03/23/22 1314  BP: 119/75  Pulse: 77  Weight: 154 lb (69.9 kg)    Fetal Status: Fetal Heart Rate (bpm): 138 Fundal Height: 33 cm Movement: Present  Presentation: Vertex  General:  Alert, oriented and cooperative. Patient is in no acute distress.  Skin: Skin is warm and dry. No rash noted.   Cardiovascular: Normal heart rate noted  Respiratory: Normal respiratory effort, no problems with respiration noted  Abdomen: Soft, gravid, appropriate for gestational age.  Pain/Pressure: Present     Pelvic: Cervical exam performed in the presence of a chaperone Dilation: 2.5 Effacement (%): 70 Station: -2  Extremities: Normal range of motion.  Edema: None  Mental Status: Normal mood and affect. Normal behavior. Normal judgment and thought content.   Assessment and Plan:  Pregnancy: G1P0000 at [redacted]w[redacted]d 1. Encounter for supervision of normal first pregnancy in third trimester Membranes stripped--for midnight IOL on 7/16. Orders placed  Term labor symptoms and general obstetric precautions including but not limited to vaginal bleeding, contractions, leaking of fluid and fetal movement were reviewed in detail with the  patient. Please refer to After Visit Summary for other counseling recommendations.   Return in 1 week (on 03/30/2022) for [facility].  Future Appointments  Date Time Provider Department Center  03/31/2022  2:30 PM Calvert Cantor, [place] [facility]    Reva Bores, MD

## 2022-03-25 ENCOUNTER — Inpatient Hospital Stay (HOSPITAL_COMMUNITY): Payer: Medicaid Other | Admitting: Anesthesiology

## 2022-03-25 ENCOUNTER — Other Ambulatory Visit: Payer: Self-pay

## 2022-03-25 ENCOUNTER — Inpatient Hospital Stay (HOSPITAL_COMMUNITY)
Admission: AD | Admit: 2022-03-25 | Discharge: 2022-03-27 | DRG: 807 | Disposition: A | Discharge status: Home / Self Care | Payer: Medicaid Other | Attending: Obstetrics & Gynecology | Admitting: Obstetrics & Gynecology

## 2022-03-25 ENCOUNTER — Encounter (HOSPITAL_COMMUNITY): Payer: Self-pay | Admitting: Obstetrics & Gynecology

## 2022-03-25 ENCOUNTER — Encounter: Payer: Self-pay | Admitting: Advanced Practice Midwife

## 2022-03-25 DIAGNOSIS — R030 Elevated blood-pressure reading, without diagnosis of hypertension: Secondary | ICD-10-CM | POA: Diagnosis present

## 2022-03-25 DIAGNOSIS — O26893 Other specified pregnancy related conditions, third trimester: Secondary | ICD-10-CM | POA: Diagnosis not present

## 2022-03-25 DIAGNOSIS — Z3A39 39 weeks gestation of pregnancy: Secondary | ICD-10-CM | POA: Diagnosis not present

## 2022-03-25 DIAGNOSIS — Z3403 Encounter for supervision of normal first pregnancy, third trimester: Principal | ICD-10-CM

## 2022-03-25 DIAGNOSIS — O99893 Other specified diseases and conditions complicating puerperium: Secondary | ICD-10-CM | POA: Diagnosis not present

## 2022-03-25 DIAGNOSIS — M79609 Pain in unspecified limb: Secondary | ICD-10-CM | POA: Diagnosis not present

## 2022-03-25 DIAGNOSIS — O480 Post-term pregnancy: Principal | ICD-10-CM | POA: Diagnosis present

## 2022-03-25 DIAGNOSIS — Z3490 Encounter for supervision of normal pregnancy, unspecified, unspecified trimester: Secondary | ICD-10-CM

## 2022-03-25 DIAGNOSIS — F332 Major depressive disorder, recurrent severe without psychotic features: Secondary | ICD-10-CM | POA: Diagnosis present

## 2022-03-25 HISTORY — DX: Poisoning by unspecified drugs, medicaments and biological substances, intentional self-harm, initial encounter: T50.902A

## 2022-03-25 LAB — CBC
HCT: 34.1 % — ABNORMAL LOW (ref 36.0–46.0)
Hemoglobin: 11.2 g/dL — ABNORMAL LOW (ref 12.0–15.0)
MCH: 26.7 pg (ref 26.0–34.0)
MCHC: 32.8 g/dL (ref 30.0–36.0)
MCV: 81.4 fL (ref 80.0–100.0)
Platelets: 171 10*3/uL (ref 150–400)
RBC: 4.19 MIL/uL (ref 3.87–5.11)
RDW: 14.7 % (ref 11.5–15.5)
WBC: 15.1 10*3/uL — ABNORMAL HIGH (ref 4.0–10.5)
nRBC: 0 % (ref 0.0–0.2)

## 2022-03-25 LAB — COMPREHENSIVE METABOLIC PANEL
ALT: 15 U/L (ref 0–44)
AST: 21 U/L (ref 15–41)
Albumin: 2.8 g/dL — ABNORMAL LOW (ref 3.5–5.0)
Alkaline Phosphatase: 208 U/L — ABNORMAL HIGH (ref 38–126)
Anion gap: 12 (ref 5–15)
BUN: 5 mg/dL — ABNORMAL LOW (ref 6–20)
CO2: 22 mmol/L (ref 22–32)
Calcium: 9.2 mg/dL (ref 8.9–10.3)
Chloride: 103 mmol/L (ref 98–111)
Creatinine, Ser: 0.52 mg/dL (ref 0.44–1.00)
GFR, Estimated: >60 mL/min (ref >60)
Glucose, Bld: 94 mg/dL (ref 70–99)
Potassium: 3.5 mmol/L (ref 3.5–5.1)
Sodium: 137 mmol/L (ref 135–145)
Total Bilirubin: 0.4 mg/dL (ref 0.3–1.2)
Total Protein: 6.6 g/dL (ref 6.5–8.1)

## 2022-03-25 LAB — URINALYSIS, ROUTINE W REFLEX MICROSCOPIC
Bilirubin Urine: NEGATIVE
Glucose, UA: NEGATIVE mg/dL
Ketones, ur: NEGATIVE mg/dL
Nitrite: NEGATIVE
Protein, ur: NEGATIVE mg/dL
Specific Gravity, Urine: 1.008 (ref 1.005–1.030)
pH: 7 (ref 5.0–8.0)

## 2022-03-25 LAB — TYPE AND SCREEN
ABO/RH(D): B POS
Antibody Screen: NEGATIVE

## 2022-03-25 LAB — PROTEIN / CREATININE RATIO, URINE
Creatinine, Urine: 70.13 mg/dL
Protein Creatinine Ratio: 0.1 mg/mg{Cre} (ref 0.00–0.15)
Total Protein, Urine: 7 mg/dL

## 2022-03-25 LAB — RPR: RPR Ser Ql: NONREACTIVE

## 2022-03-25 MED ORDER — TERBUTALINE SULFATE 1 MG/ML IJ SOLN: 0.2500 mg | Freq: Once | INTRAMUSCULAR | Status: DC | PRN

## 2022-03-25 MED ORDER — OXYCODONE-ACETAMINOPHEN 5-325 MG PO TABS: 2.0000 | ORAL_TABLET | ORAL | Status: DC | PRN

## 2022-03-25 MED ORDER — LACTATED RINGERS IV SOLN: INTRAVENOUS | Status: DC

## 2022-03-25 MED ORDER — LIDOCAINE HCL (PF) 1 % IJ SOLN
INTRAMUSCULAR | Status: DC | PRN
  Administered 2022-03-25: 10 mL via EPIDURAL
  Administered 2022-03-25: 2 mL via EPIDURAL

## 2022-03-25 MED ORDER — BENZOCAINE-MENTHOL 20-0.5 % EX AERO: 1.0000 | INHALATION_SPRAY | CUTANEOUS | Status: DC | PRN

## 2022-03-25 MED ORDER — OXYTOCIN BOLUS FROM INFUSION
333.0000 mL | Freq: Once | INTRAVENOUS | Status: AC
Start: 2022-03-25 — End: 2022-03-25

## 2022-03-25 MED ORDER — SIMETHICONE 80 MG PO CHEW: 80.0000 mg | CHEWABLE_TABLET | ORAL | Status: DC | PRN

## 2022-03-25 MED ORDER — LIDOCAINE HCL (PF) 1 % IJ SOLN: 30.0000 mL | INTRAMUSCULAR | Status: DC | PRN

## 2022-03-25 MED ORDER — SOD CITRATE-CITRIC ACID 500-334 MG/5ML PO SOLN: 30.0000 mL | ORAL | Status: DC | PRN

## 2022-03-25 MED ORDER — ACETAMINOPHEN 325 MG PO TABS: 650.0000 mg | ORAL_TABLET | ORAL | Status: DC | PRN

## 2022-03-25 MED ORDER — LACTATED RINGERS IV SOLN
500.0000 mL | Freq: Once | INTRAVENOUS | Status: AC
  Administered 2022-03-25: 500 mL via INTRAVENOUS

## 2022-03-25 MED ORDER — FENTANYL-BUPIVACAINE-NACL 0.5-0.125-0.9 MG/250ML-% EP SOLN
12.0000 mL/h | EPIDURAL | Status: DC | PRN
  Filled 2022-03-25: qty 250

## 2022-03-25 MED ORDER — EPHEDRINE 5 MG/ML INJ
10.0000 mg | INTRAVENOUS | Status: DC | PRN
  Filled 2022-03-25: qty 5

## 2022-03-25 MED ORDER — EPHEDRINE 5 MG/ML INJ: 10.0000 mg | INTRAVENOUS | Status: DC | PRN

## 2022-03-25 MED ORDER — FENTANYL-BUPIVACAINE-NACL 0.5-0.125-0.9 MG/250ML-% EP SOLN
EPIDURAL | Status: DC | PRN
  Administered 2022-03-25: 12 mL/h via EPIDURAL

## 2022-03-25 MED ORDER — ONDANSETRON HCL 4 MG PO TABS: 4.0000 mg | ORAL_TABLET | ORAL | Status: DC | PRN

## 2022-03-25 MED ORDER — COCONUT OIL OIL: 1.0000 | TOPICAL_OIL | Status: DC | PRN

## 2022-03-25 MED ORDER — OXYCODONE-ACETAMINOPHEN 5-325 MG PO TABS: 1.0000 | ORAL_TABLET | ORAL | Status: DC | PRN

## 2022-03-25 MED ORDER — FLEET ENEMA 7-19 GM/118ML RE ENEM: 1.0000 | ENEMA | RECTAL | Status: DC | PRN

## 2022-03-25 MED ORDER — PRENATAL MULTIVITAMIN CH
1.0000 | ORAL_TABLET | Freq: Every day | ORAL | Status: DC
  Administered 2022-03-26: 1 via ORAL
  Filled 2022-03-25: qty 1

## 2022-03-25 MED ORDER — LACTATED RINGERS IV SOLN
500.0000 mL | INTRAVENOUS | Status: DC | PRN
  Administered 2022-03-25: 1000 mL via INTRAVENOUS

## 2022-03-25 MED ORDER — OXYTOCIN-SODIUM CHLORIDE 30-0.9 UT/500ML-% IV SOLN: 1.0000 m[IU]/min | INTRAVENOUS | Status: DC

## 2022-03-25 MED ORDER — ONDANSETRON HCL 4 MG/2ML IJ SOLN: 4.0000 mg | INTRAMUSCULAR | Status: DC | PRN

## 2022-03-25 MED ORDER — DIBUCAINE (PERIANAL) 1 % EX OINT: 1.0000 | TOPICAL_OINTMENT | CUTANEOUS | Status: DC | PRN

## 2022-03-25 MED ORDER — DIPHENHYDRAMINE HCL 50 MG/ML IJ SOLN: 12.5000 mg | INTRAMUSCULAR | Status: DC | PRN

## 2022-03-25 MED ORDER — SENNOSIDES-DOCUSATE SODIUM 8.6-50 MG PO TABS
2.0000 | ORAL_TABLET | Freq: Every day | ORAL | Status: DC
  Administered 2022-03-26 – 2022-03-27 (×2): 2 via ORAL
  Filled 2022-03-25 (×2): qty 2

## 2022-03-25 MED ORDER — OXYTOCIN-SODIUM CHLORIDE 30-0.9 UT/500ML-% IV SOLN
2.5000 [IU]/h | INTRAVENOUS | Status: DC
  Administered 2022-03-25: 2.5 [IU]/h via INTRAVENOUS

## 2022-03-25 MED ORDER — ONDANSETRON HCL 4 MG/2ML IJ SOLN
4.0000 mg | Freq: Four times a day (QID) | INTRAMUSCULAR | Status: DC | PRN
Start: 2022-03-25 — End: 2022-03-25

## 2022-03-25 MED ORDER — DIPHENHYDRAMINE HCL 25 MG PO CAPS: 25.0000 mg | ORAL_CAPSULE | Freq: Four times a day (QID) | ORAL | Status: DC | PRN

## 2022-03-25 MED ORDER — WITCH HAZEL-GLYCERIN EX PADS: 1.0000 | MEDICATED_PAD | CUTANEOUS | Status: DC | PRN

## 2022-03-25 MED ORDER — PHENYLEPHRINE 80 MCG/ML (10ML) SYRINGE FOR IV PUSH (FOR BLOOD PRESSURE SUPPORT)
80.0000 ug | PREFILLED_SYRINGE | INTRAVENOUS | Status: DC | PRN
  Filled 2022-03-25: qty 10

## 2022-03-25 MED ORDER — PHENYLEPHRINE 80 MCG/ML (10ML) SYRINGE FOR IV PUSH (FOR BLOOD PRESSURE SUPPORT): 80.0000 ug | PREFILLED_SYRINGE | INTRAVENOUS | Status: DC | PRN

## 2022-03-25 MED ORDER — OXYTOCIN-SODIUM CHLORIDE 30-0.9 UT/500ML-% IV SOLN
INTRAVENOUS | Status: AC
  Administered 2022-03-25: 333 mL via INTRAVENOUS
  Filled 2022-03-25: qty 500

## 2022-03-25 MED ORDER — IBUPROFEN 600 MG PO TABS
600.0000 mg | ORAL_TABLET | Freq: Four times a day (QID) | ORAL | Status: DC
  Administered 2022-03-26 – 2022-03-27 (×4): 600 mg via ORAL
  Filled 2022-03-25 (×6): qty 1

## 2022-03-25 NOTE — H&P (Signed)
Obstetric History and Physical  Hannah York is a 21 y.o. G1P0000 with IUP at [redacted]w[redacted]d presenting for evaluation of elevated BP at home of 137/92.  Patient denies any headaches, visual symptoms, but reports intermittent epigastric pain.  No other concerning symptoms.  Patient states she has been having  regular, every 5 minutes contractions,  no  vaginal bleeding, intact membranes, with active fetal movement.    Prenatal Course Source of Care: [facility]  with onset of care at 7 weeks Pregnancy complications or risks: Patient Active Problem List   Diagnosis Date Noted   Supervision of normal pregnancy 09/16/2021   Major depressive disorder, recurrent severe without psychotic features (HCC) 09/22/2019   Inattention 08/27/2019   Hyperactivity 08/27/2019   Nursing Staff Provider  Office Location  [place] Dating   7 week [place]  Language  ENGLISH  Anatomy US  Normal  Flu Vaccine  09/16/21 Genetic/Carrier Screen  NIPS:  Low risk female, negative horizon  AFP:  Negative  Horizon: Negative  TDaP Vaccine   12/2021 Hgb A1C or  GTT WNL  Component     Latest Ref Rng 01/06/2022  Glucose, Fasting     70 - 91 mg/dL 75   Glucose, 1 hour     70 - 179 mg/dL 409   Glucose, 2 hour     70 - 152 mg/dL 811     COVID Vaccine    LAB RESULTS   Rhogam  NA Blood Type B/Positive/-- (12/29 1622)   Baby Feeding Plan Breast/ both Antibody Negative (12/29 1622)  Contraception undecided Rubella 1.09 (12/29 1622)  Circumcision IN/A RPR Non Reactive (04/20 0849)   Pediatrician  [facility] in [place]  HBsAg Negative (12/29 1622)   Support Person FOB HCVAb  Neg  Prenatal Classes  HIV Non Reactive (04/20 0849)     BTL Consent  GBS Negative/-- (06/15 1135)  VBAC Consent  Pap  N/A    Past Medical History:  Diagnosis Date   Chlamydia    Intentional drug overdose (HCC)     Past Surgical History:  Procedure Laterality Date   NO PAST SURGERIES      OB History  Gravida Para Term Preterm AB  Living  1 0 0 0 0 0  SAB IAB Ectopic Multiple Live Births  0 0 0 0 0    # Outcome Date GA Lbr Len/2nd Weight Sex Delivery Anes PTL Lv  1 Current             Social History   Socioeconomic History   Marital status: Single    Spouse name: Education administrator   Number of children: Not on file   Years of education: Not on file   Highest education level: Not on file  Occupational History   Occupation: Research scientist (physical sciences)    Comment: [employer]  Tobacco Use   Smoking status: Never   Smokeless tobacco: Never  Vaping Use   Vaping Use: Never used  Substance and Sexual Activity   Alcohol use: Never   Drug use: Never   Sexual activity: Yes    Birth control/protection: None  Other Topics Concern   Not on file  Social History Narrative   Lives with Sig other.   Social Determinants of Health   Financial Resource Strain: Not on file  Food Insecurity: Not on file  Transportation Needs: Not on file  Physical Activity: Not on file  Stress: No Stress Concern Present (12/07/2018)   [employer] of  Occupational Health - Occupational Stress Questionnaire    Feeling of Stress : Not at all  Social Connections: Not on file    Family History  Problem Relation Age of Onset   Healthy Mother    Schizophrenia Father    Asthma Brother    Diabetes Maternal Grandmother    Hypertension Maternal Grandmother    Alzheimer's disease Maternal Grandfather     Medications Prior to Admission  Medication Sig Dispense Refill Last Dose   Prenatal Vit-Fe Fumarate-FA (PRENATAL MULTIVITAMIN) TABS tablet Take 1 tablet by mouth daily at 12 noon.   03/24/2022   aspirin EC 81 MG tablet Take 1 tablet (81 mg total) by mouth daily. Take after 12 weeks for prevention of preeclampsia later in pregnancy (Patient not taking: Reported on 02/17/2022) 300 tablet 2    ferrous sulfate (FERROUSUL) 325 (65 FE) MG tablet Take 1 tablet (325 mg total) by mouth 2 (two) times daily. 60 tablet 3    folic acid (FOLVITE) 1 MG tablet Take  1 tablet (1 mg total) by mouth daily. 30 tablet 10     No Known Allergies  Review of Systems: Negative except for what is mentioned in HPI.  Physical Exam: BP 132/87   Pulse 79   Temp 98.6 F (37 C) (Oral)   Resp 18   Ht 5\' 5"  (1.651 m)   Wt 68.8 kg   LMP 06/23/2021   SpO2 100%   BMI 25.23 kg/m  CONSTITUTIONAL: Well-developed, well-nourished female in no acute distress.  HENT:  Normocephalic, atraumatic, External right and left ear normal. Oropharynx is clear and moist EYES: Conjunctivae and EOM are normal. Pupils are equal, round, and reactive to light. No scleral icterus.  NECK: Normal range of motion, supple, no masses SKIN: Skin is warm and dry. No rash noted. Not diaphoretic. No erythema. No pallor. NEUROLOGIC: Alert and oriented to person, place, and time. Normal reflexes, muscle tone coordination. No cranial nerve deficit noted. PSYCHIATRIC: Normal mood and affect. Normal behavior. Normal judgment and thought content. CARDIOVASCULAR: Normal heart rate noted, regular rhythm RESPIRATORY: Effort and breath sounds normal, no problems with respiration noted ABDOMEN: Soft, nontender, nondistended, gravid. MUSCULOSKELETAL: Normal range of motion. No edema and no tenderness. 2+ distal pulses.  Cervical Exam: Dilatation 4-5cm   Effacement 100%   Station 0   Presentation: cephalic FHT:  Baseline rate 130 bpm   Variability moderate  Accelerations present   Decelerations none Contractions: Every 5 mins   Pertinent Labs/Studies:   Results for orders placed or performed during the hospital encounter of 03/25/22 (from the past 24 hour(s))  Urinalysis, Routine w reflex microscopic Urine, Clean Catch     Status: Abnormal   Collection Time: 03/25/22 11:06 AM  Result Value Ref Range   Color, Urine YELLOW YELLOW   APPearance HAZY (A) CLEAR   Specific Gravity, Urine 1.008 1.005 - 1.030   pH 7.0 5.0 - 8.0   Glucose, UA NEGATIVE NEGATIVE mg/dL   Hgb urine dipstick LARGE (A) NEGATIVE    Bilirubin Urine NEGATIVE NEGATIVE   Ketones, ur NEGATIVE NEGATIVE mg/dL   Protein, ur NEGATIVE NEGATIVE mg/dL   Nitrite NEGATIVE NEGATIVE   Leukocytes,Ua MODERATE (A) NEGATIVE   RBC / HPF 0-5 0 - 5 RBC/hpf   WBC, UA 21-50 0 - 5 WBC/hpf   Bacteria, UA RARE (A) NONE SEEN   Squamous Epithelial / LPF 0-5 0 - 5   Mucus PRESENT     Assessment : Hannah York is a 21  y.o. G1P0000 at [redacted]w[redacted]d being admitted for labor  Plan: Labor: Expectant management. Augmentation with pitocin or AROM as needed. Analgesia as needed. BP: BPs 130s/80s here in MAU, will follow up labs.  Continue close BP monitoring.  FWB: Reassuring fetal heart tracing.  GBS negative Delivery plan: Hopeful for vaginal delivery   Jaynie Collins, MD, FACOG Obstetrician & Gynecologist, [facility] for [employer], [facility]

## 2022-03-25 NOTE — Discharge Summary (Signed)
Postpartum Discharge Summary  Date of Service updated***     Patient Name: Hannah York DOB: 2001/03/10 MRN: [id number]  Date of admission: 03/25/2022 Delivery date:03/25/2022  Delivering provider: Gaylan Gerold R  Date of discharge: 03/25/2022  Admitting diagnosis: Post term pregnancy over 40 weeks [O48.0] Intrauterine pregnancy: [redacted]w[redacted]d    Secondary diagnosis:  Principal Problem:   Post term pregnancy over 40 weeks  Additional problems: ***    Discharge diagnosis: Term Pregnancy Delivered                                              Postpartum procedures:{Postpartum procedures:23558} Augmentation: AROM and Pitocin Complications: None  Hospital course: Onset of Labor With Vaginal Delivery      21y.o. yo G1P0000 at 317w2das admitted in Active Labor on 03/25/2022. Patient had an uncomplicated labor course as follows:  Membrane Rupture Time/Date: 4:43 PM ,03/25/2022   Delivery Method:Vaginal, Spontaneous  Episiotomy: None  Lacerations:  None  Patient had an uncomplicated postpartum course.  She is ambulating, tolerating a regular diet, passing flatus, and urinating well. Patient is discharged home in stable condition on 03/25/22.  Newborn Data: Birth date:03/25/2022  Birth time:8:08 PM  Gender:Female  Living status:Living  Apgars:9 ,9  Weight:   Magnesium Sulfate received: No BMZ received: No Rhophylac:N/A MMR:N/A T-DaP:Given prenatally Flu: N/A Transfusion:No  Physical exam  Vitals:   03/25/22 1841 03/25/22 1846 03/25/22 2000 03/25/22 2015  BP: 131/89 (!) 123/92 138/79 130/74  Pulse: 61 65 69 81  Resp:   16 16  Temp:      TempSrc:      SpO2:      Weight:      Height:       General: {Exam; general:21111117} Lochia: {Desc; appropriate/inappropriate:30686::"appropriate"} Uterine Fundus: {Desc; firm/soft:30687} Incision: {Exam; incision:21111123} DVT Evaluation: {Exam; dvt:2111122} Labs: Lab Results  Component Value Date   WBC 15.1 (H) 03/25/2022   HGB  11.2 (L) 03/25/2022   HCT 34.1 (L) 03/25/2022   MCV 81.4 03/25/2022   PLT 171 03/25/2022      Latest Ref Rng & Units 03/25/2022   11:25 AM  CMP  Glucose 70 - 99 mg/dL 94   BUN 6 - 20 mg/dL 5   Creatinine 0.44 - 1.00 mg/dL 0.52   Sodium 135 - 145 mmol/L 137   Potassium 3.5 - 5.1 mmol/L 3.5   Chloride 98 - 111 mmol/L 103   CO2 22 - 32 mmol/L 22   Calcium 8.9 - 10.3 mg/dL 9.2   Total Protein 6.5 - 8.1 g/dL 6.6   Total Bilirubin 0.3 - 1.2 mg/dL 0.4   Alkaline Phos 38 - 126 U/L 208   AST 15 - 41 U/L 21   ALT 0 - 44 U/L 15    Edinburgh Score:     No data to display           After visit meds:  Allergies as of 03/25/2022   No Known Allergies   Med [facility] must [facility] completed [facility]      Discharge home in stable condition Infant Feeding: {Baby feeding:23562} Infant Disposition:{CHL IP OB HOME WITH MOTELMRA:15183}ischarge instruction: per After Visit Summary and Postpartum booklet. Activity: Advance as tolerated. Pelvic rest for 6 weeks.  Diet: {OB diUPBD:57897847}Future Appointments: Future Appointments  Date Time Provider [place]  03/31/2022  2:30 PM Darlina Rumpf, CNM [facility]  04/03/2022 12:00 AM [facility] [place] [facility] None   Follow up Visit: Message sent to [place] by Gaylan Gerold, CNM Please schedule this patient for a In person postpartum visit in 4 weeks with the following provider: Any provider. Additional Postpartum F/U:Postpartum Depression checkup  Low risk pregnancy complicated by:  None Delivery mode:  Vaginal, Spontaneous  Anticipated Birth Control:  Unsure   03/25/2022 Gabriel Carina, CNM

## 2022-03-25 NOTE — Anesthesia Procedure Notes (Signed)
Epidural Patient location during procedure: OB Start time: 03/25/2022 6:02 PM End time: 03/25/2022 6:08 PM  Staffing Anesthesiologist: Lannie Fields, DO Performed: anesthesiologist   Preanesthetic Checklist Completed: patient identified, IV checked, risks and benefits discussed, monitors and equipment checked, pre-op evaluation and timeout performed  Epidural Patient position: sitting Prep: DuraPrep and site prepped and draped Patient monitoring: continuous pulse ox, blood pressure, heart rate and cardiac monitor Approach: midline Location: L3-L4 Injection technique: LOR air  Needle:  Needle type: Tuohy  Needle gauge: 17 G Needle length: 9 cm Needle insertion depth: 5 cm Catheter type: closed end flexible Catheter size: 19 Gauge Catheter at skin depth: 10 cm Test dose: negative  Assessment Sensory level: T8 Events: blood not aspirated, injection not painful, no injection resistance, no paresthesia and negative IV test  Additional Notes Patient identified. Risks/Benefits/Options discussed with patient including but not limited to bleeding, infection, nerve damage, paralysis, failed block, incomplete pain control, headache, blood pressure changes, nausea, vomiting, reactions to medication both or allergic, itching and postpartum back pain. Confirmed with bedside nurse the patient's most recent platelet count. Confirmed with patient that they are not currently taking any anticoagulation, have any bleeding history or any family history of bleeding disorders. Patient expressed understanding and wished to proceed. All questions were answered. Sterile technique was used throughout the entire procedure. Please see nursing notes for vital signs. Test dose was given through epidural catheter and negative prior to continuing to dose epidural or start infusion. Warning signs of high block given to the patient including shortness of breath, tingling/numbness in hands, complete motor block,  or any concerning symptoms with instructions to call for help. Patient was given instructions on fall risk and not to get out of bed. All questions and concerns addressed with instructions to call with any issues or inadequate analgesia.  Reason for block:procedure for pain

## 2022-03-25 NOTE — MAU Note (Signed)
Hannah York is a 21 y.o. at [redacted]w[redacted]d here in MAU reporting: she took her BP this morning, BP 137/92.  Denies H/A, visual disturbances, but endorses intermittent epigastric pain (describes as stinging).  Also reports had membranes stripped 2 days ago, has had spotting since, no bright red VB.  Denies LOF.  States having intermittent abdominal pain every 5 minutes since 0500 this morning LMP: N/A Onset of complaint: today Pain score: 7/10 Vitals:   03/25/22 1046  BP: 123/82  Pulse: 81  Resp: 18  Temp: 98.6 F (37 C)  SpO2: 100%     FHT: 123 bpm Lab orders placed from triage: UA

## 2022-03-25 NOTE — MAU Provider Note (Signed)
Please refer to H & P for admission details.    Olimpia Tinch, MD, FACOG Attending Obstetrician & Gynecologist Faculty Practice, [facility]   

## 2022-03-25 NOTE — Progress Notes (Signed)
Patient ID: Gertie Gowda, female   DOB: 01-Jul-2001, 21 y.o.   MRN: [id number]  Labor Progress Note BENETTA MACLAREN is a 21 y.o. G1P0000 at [redacted]w[redacted]d presented for spontaneous onset of labor with elevated BP at term.  S:  Pt uncomfortable, in the bed with FOB and MGM at bedside for support. Wanting an unmedicated delivery if possible.  O:  BP 139/85   Pulse 74   Temp 98.3 F (36.8 C) (Oral)   Resp 18   Ht 5\' 5"  (1.651 m)   Wt 155 lb (70.3 kg)   LMP 06/23/2021   SpO2 98%   BMI 25.79 kg/m  EFM: baseline 135 bpm/ moderate variability/ 15x15 accels/ no decels  Toco/IUPC: q3-65min SVE: Dilation: 4.5 Effacement (%): 100 Station: 0 Presentation: Vertex Exam by:: Anyanwu,MD Pitocin: None  A/P: 21 y.o. G1P0000 [redacted]w[redacted]d  1. Labor: Early progressing into active 2. FWB: Cat 1 3. Pain: Well tolerated with deep breathing and familial support 4. HTN: no s/sx PEC, labs normal  Strongly encouraged to ambulate, will assess for AROM in the next few hours. Anticipate SVD.  [redacted]w[redacted]d, CNM, MSN, IBCLC Certified Nurse Midwife, [facility]

## 2022-03-25 NOTE — Lactation Note (Signed)
This note was copied from a baby's chart. Lactation Consultation Note Latched baby to the breast w/ease. Placed pillows for support to arm during feeding. Mom denies painful latch. Encouraged cheeks to breast and occasional breast massage during feeding.  Noted milk transfer. Praised mom. Will f/u mom on MBU.  Patient Name: Girl Aviyanna Colbaugh [id number] Date: 03/25/2022 Reason for consult: L&D Initial assessment;Primapara;Term Age:20 hours  Maternal Data Does the patient have breastfeeding experience prior to this delivery?: No  Feeding    LATCH Score Latch: Grasps breast easily, tongue down, lips flanged, rhythmical sucking.  Audible Swallowing: A few with stimulation  Type of Nipple: Everted at rest and after stimulation  Comfort (Breast/Nipple): Soft / non-tender  Hold (Positioning): Assistance needed to correctly position infant at breast and maintain latch.  LATCH Score: 8   Lactation Tools Discussed/Used    Interventions Interventions: Adjust position;Assisted with latch;Support pillows;Skin to skin;Breast compression  Discharge    Consult Status Consult Status: Follow-up from L&D Date: 03/26/22 Follow-up type: In-patient    Charyl Dancer 03/25/2022, 9:27 PM

## 2022-03-25 NOTE — Anesthesia Preprocedure Evaluation (Signed)
Anesthesia Evaluation  Patient identified by MRN, date of birth, ID band Patient awake    Reviewed: Allergy & Precautions, Patient's Chart, lab work & pertinent test results  Airway Mallampati: II  TM Distance: >3 FB Neck ROM: Full    Dental no notable dental hx.    Pulmonary neg pulmonary ROS,    Pulmonary exam normal breath sounds clear to auscultation       Cardiovascular negative cardio ROS Normal cardiovascular exam Rhythm:Regular Rate:Normal     Neuro/Psych PSYCHIATRIC DISORDERS Depression negative neurological ROS     GI/Hepatic negative GI ROS, Neg liver ROS,   Endo/Other  negative endocrine ROS  Renal/GU negative Renal ROS  negative genitourinary   Musculoskeletal negative musculoskeletal ROS (+)   Abdominal   Peds negative pediatric ROS (+)  Hematology negative hematology ROS (+) Hb 11.2, plt 171   Anesthesia Other Findings   Reproductive/Obstetrics (+) Pregnancy                             Anesthesia Physical Anesthesia Plan  ASA: 2  Anesthesia Plan: Epidural   Post-op Pain Management:    Induction:   PONV Risk Score and Plan: 2  Airway Management Planned: Natural Airway  Additional Equipment: None  Intra-op Plan:   Post-operative Plan:   Informed Consent: I have reviewed the patients History and Physical, chart, labs and discussed the procedure including the risks, benefits and alternatives for the proposed anesthesia with the patient or authorized representative who has indicated his/her understanding and acceptance.       Plan Discussed with:   Anesthesia Plan Comments:         Anesthesia Quick Evaluation

## 2022-03-25 NOTE — Plan of Care (Signed)

## 2022-03-26 ENCOUNTER — Inpatient Hospital Stay (HOSPITAL_COMMUNITY): Payer: Medicaid Other

## 2022-03-26 DIAGNOSIS — O99893 Other specified diseases and conditions complicating puerperium: Secondary | ICD-10-CM | POA: Diagnosis not present

## 2022-03-26 DIAGNOSIS — M79609 Pain in unspecified limb: Secondary | ICD-10-CM

## 2022-03-26 LAB — CBC
HCT: 29.9 % — ABNORMAL LOW (ref 36.0–46.0)
Hemoglobin: 10 g/dL — ABNORMAL LOW (ref 12.0–15.0)
MCH: 26.8 pg (ref 26.0–34.0)
MCHC: 33.4 g/dL (ref 30.0–36.0)
MCV: 80.2 fL (ref 80.0–100.0)
Platelets: 155 10*3/uL (ref 150–400)
RBC: 3.73 MIL/uL — ABNORMAL LOW (ref 3.87–5.11)
RDW: 15 % (ref 11.5–15.5)
WBC: 20.2 10*3/uL — ABNORMAL HIGH (ref 4.0–10.5)
nRBC: 0 % (ref 0.0–0.2)

## 2022-03-26 MED ORDER — FUROSEMIDE 20 MG PO TABS
20.0000 mg | ORAL_TABLET | Freq: Every day | ORAL | Status: DC
  Administered 2022-03-26 – 2022-03-27 (×2): 20 mg via ORAL
  Filled 2022-03-26 (×2): qty 1

## 2022-03-26 NOTE — Plan of Care (Signed)

## 2022-03-26 NOTE — Progress Notes (Signed)
CSW received consult for hx of Depression. CSW met with MOB to offer support and complete assessment. When CSW entered room, MOB was observed laying in hospital bed. MOB's mother and infant's godmother were present, holding infant "Nova." MOB provided verbal consent to speak in front of MOB's mother and infant's godmother about anything. MOB was polite and easy to engage.   CSW inquired about MOB's mental health history. MOB reports she does not have a formal diagnosis of depression. MOB states the last time she experienced depression was about 4 years ago. MOB reports her depression symptoms at this time were triggered by stress from school, marked by feeling overwhelmed about getting into college. MOB denies experiencing symptoms of depression during pregnancy. MOB reports she has no additional mental health history. However, per chart review, MOB presented to the ED 09/2019 for an intentional drug overdose. CSW did not address MOB's ED encounter during current assessment. MOB denies current SI/HI. Domestic violence was not assessed due to MOB's family members being in the room. MOB states she is not currently interested in taking psychotropic medication or attending therapy. MOB declined mental health resources at this time. MOB identified her mom as a support and shared she copes with stressors by talking with her mom. CSW inquired how MOB has felt emotionally since giving birth. MOB reports she is feeling "happy."  MOB reports she has all necessary items for infant, including a car seat and basinet. CSW inquired if MOB is interested in any additional parenting resources. MOB shared that she does not want to sign up for any parenting resources but is open to seeing what is available. CSW will provide MOB with Parents as Teachers resource information as an optional parenting resource. MOB reports she has chosen infant's pediatrician, [facility] located in [place].  CSW provided education  regarding the baby blues period vs. perinatal mood disorders, discussed treatment and gave resources for mental health follow up if concerns arise.  CSW recommends self-evaluation during the postpartum time period using the New Mom Checklist from Postpartum Progress and encouraged MOB to contact a medical professional if symptoms are noted at any time.   CSW provided review of Sudden Infant Death Syndrome (SIDS) precautions.    CSW identifies no further need for intervention and no barriers to discharge at this time.  Signed,  Berniece Salines, MSW, [place] 03/26/2022 5:01 PM

## 2022-03-26 NOTE — Progress Notes (Signed)
VASCULAR LAB    Left lower extremity venous duplex has been performed.  See CV proc for preliminary results.  Messaged results to Randa Evens, RN via secure chat  Sherren Kerns, RVT 03/26/2022, 4:17 PM

## 2022-03-26 NOTE — Anesthesia Postprocedure Evaluation (Signed)
Anesthesia Post Note  Patient: Hannah York  Procedure(s) Performed: AN AD HOC LABOR EPIDURAL     Patient location during evaluation: Mother Baby Anesthesia Type: Epidural Level of consciousness: awake Pain management: satisfactory to patient Vital Signs Assessment: post-procedure vital signs reviewed and stable Respiratory status: spontaneous breathing Cardiovascular status: stable Anesthetic complications: no   No notable events documented.  Last Vitals:  Vitals:   03/26/22 0320 03/26/22 0734  BP: 121/78 130/79  Pulse: (!) 58 (!) 58  Resp: 18 17  Temp: 36.9 C 36.9 C  SpO2:      Last Pain:  Vitals:   03/26/22 0734  TempSrc: Oral  PainSc:    Pain Goal: Patients Stated Pain Goal: 0 (03/25/22 1753)                 Cephus Shelling

## 2022-03-26 NOTE — Progress Notes (Signed)
Post Partum Day 1 Subjective: up ad lib, voiding, tolerating PO, and LLE cramping pain since after delivery  Objective: Blood pressure 130/79, pulse (!) 58, temperature 98.5 F (36.9 C), temperature source Oral, resp. rate 17, height 5\' 5"  (1.651 m), weight 70.3 kg, last menstrual period 06/23/2021, SpO2 100 %, unknown if currently breastfeeding. Today's Vitals   03/25/22 2210 03/25/22 2318 03/26/22 0320 03/26/22 0734  BP: 124/77 117/80 121/78 130/79  Pulse: 67  (!) 58 (!) 58  Resp: 17 16 18 17   Temp: 98 F (36.7 C) 98.4 F (36.9 C) 98.4 F (36.9 C) 98.5 F (36.9 C)  TempSrc: Oral Oral Oral Oral  SpO2:      Weight:      Height:      PainSc: 0-No pain      Body mass index is 25.79 kg/m.  Physical Exam:  General: alert, cooperative, and appears stated age Lochia: appropriate Uterine Fundus: firm DVT Evaluation: Posterior calf tenderness present.  Recent Labs    03/25/22 1125 03/26/22 0506  HGB 11.2* 10.0*  HCT 34.1* 29.9*    Assessment/Plan: Plan for discharge tomorrow, Breastfeeding, Lactation consult, and Contraception undecided Lasix for Borderline BP LE doppler today   LOS: 1 day   05/26/22, MD 03/26/2022, 8:54 AM

## 2022-03-26 NOTE — Lactation Note (Signed)
This note was copied from a baby's chart. Lactation Consultation Note  Patient Name: Girl Zia Najera EFEOF'H Date: 03/26/2022 Reason for consult: Initial assessment Age:20 hours  P1, Room full of visitors.  Suggest mother call for assistance with breastfeeding.  Discussed basics.  LC returned to room and mother states baby recently breastfed for approx 20 min.  Baby STS. Feed on demand with cues.  Goal 8-12+ times per day after first 24 hrs.  Place baby STS if not cueing.  Mom made aware of O/P services, breastfeeding support groups, community resources, and our phone # for post-discharge questions.    Maternal Data Has patient been taught Hand Expression?: Yes Does the patient have breastfeeding experience prior to this delivery?: No  Feeding Mother's Current Feeding Choice: Breast Milk  Interventions Interventions: Breast feeding basics reviewed;Education;LC Services brochure  Consult Status Consult Status: Follow-up Date: 03/27/22 Follow-up type: In-patient    Dahlia Byes [facility] 03/26/2022, 11:33 AM

## 2022-03-27 MED ORDER — IBUPROFEN 600 MG PO TABS: 600.0000 mg | ORAL_TABLET | Freq: Four times a day (QID) | ORAL | 0 refills | Status: DC

## 2022-03-27 MED ORDER — ACETAMINOPHEN 325 MG PO TABS: 650.0000 mg | ORAL_TABLET | ORAL | 0 refills | Status: DC | PRN

## 2022-03-27 MED ORDER — FUROSEMIDE 20 MG PO TABS: 20.0000 mg | ORAL_TABLET | Freq: Every day | ORAL | 0 refills | Status: DC

## 2022-03-27 NOTE — Lactation Note (Signed)
This note was copied from a baby's chart. Lactation Consultation Note  Patient Name: Girl Alexys Lobello SHFWY'O Date: 03/27/2022 Reason for consult: Follow-up assessment;1st time breastfeeding;Primapara;Term Age:20 hours  P1 mother whose infant is now 16 hours old.  This is a term baby at 39+2 weeks.  Mother's current feeding preference is breast/formula.  Mother reported that "[place]" is latching well; she prefers the left breast.  Mother will have some sensitivity to the right breast which dissipates as "[place]" continues to feed.  Mother is supplementing with formula; can increase volumes as needed.  Allowed time for questions.  Family present and assisting with care.  Manual pump at bedside and mother has been able to obtain small amounts of colostrum.  #24 flange size is appropriate.  Family has our OP phone number for any concerns after discharge.    Maternal Data    Feeding Mother's Current Feeding Choice: Breast Milk and Formula  LATCH Score                    Lactation Tools Discussed/Used    Interventions    Discharge Discharge Education: Engorgement and breast care  Consult Status Consult Status: Complete Date: 03/27/22 Follow-up type: Call as needed    Kendy Haston R Saranne Crislip 03/27/2022, 8:05 AM

## 2022-03-27 NOTE — Discharge Instructions (Signed)
Goal blood pressure: Under 130/80

## 2022-03-31 ENCOUNTER — Encounter: Payer: Self-pay | Admitting: Advanced Practice Midwife

## 2022-03-31 ENCOUNTER — Encounter: Payer: Medicaid Other | Admitting: Advanced Practice Midwife

## 2022-03-31 ENCOUNTER — Telehealth: Payer: Self-pay | Admitting: Licensed Clinical Social Worker

## 2022-03-31 ENCOUNTER — Encounter: Payer: Medicaid Other | Admitting: Licensed Clinical Social Worker

## 2022-03-31 NOTE — Telephone Encounter (Signed)
Called pt regarding postpartum mood check. Unable to leave message mailbox is full

## 2022-04-03 ENCOUNTER — Inpatient Hospital Stay (HOSPITAL_COMMUNITY)
Admission: AD | Admit: 2022-04-03 | Payer: Medicaid Other | Source: Home / Self Care | Admitting: Obstetrics & Gynecology

## 2022-04-03 ENCOUNTER — Inpatient Hospital Stay (HOSPITAL_COMMUNITY): Payer: Medicaid Other

## 2022-04-04 ENCOUNTER — Ambulatory Visit: Payer: Medicaid Other

## 2022-04-04 NOTE — Progress Notes (Unsigned)
Subjective:  Hannah York is a 21 y.o. female here for BP check.   Hypertension ROS: Patient denies any headaches, visual symptoms, RUQ/epigastric pain or other concerning symptoms.  Objective:  BP 120/78   Pulse 69   LMP 06/23/2021   Breastfeeding Yes Comment: Does pump  Appearance alert, well appearing, and in no distress. General exam BP noted to be well controlled today in office.    Assessment:   Blood Pressure well controlled.   Plan:  Follow up on Postpartum visit 05/03/2022 or as needed .   Genene Churn, NT CMA Student

## 2022-04-04 NOTE — Progress Notes (Deleted)
Subjective:  Hannah York is a 21 y.o. female here for BP check.   Hypertension ROS: Patient denies any headaches, visual symptoms, RUQ/epigastric pain or other concerning symptoms.  Objective:  BP 120/78   Pulse 69   LMP 06/23/2021   Breastfeeding Yes Comment: Does pump  Appearance {appearance:315021::"alert, well appearing, and in no distress"}. General exam BP noted to be *** today in office.    Assessment:   Blood Pressure {disease control degree:315147}.   Plan:  {disease follow up plans:315730}.

## 2022-04-06 ENCOUNTER — Encounter: Payer: Self-pay | Admitting: Advanced Practice Midwife

## 2022-04-12 ENCOUNTER — Encounter: Payer: Medicaid Other | Admitting: Licensed Clinical Social Worker

## 2022-04-19 DIAGNOSIS — Z419 Encounter for procedure for purposes other than remedying health state, unspecified: Secondary | ICD-10-CM | POA: Diagnosis not present

## 2022-05-01 IMAGING — US US MFM OB COMP +14 WKS
1 series · 13 of 28 positions shown · non-contrast
Comparison: none

[Series 1: us mfm ob comp +14 wks · 13 of 97 slices shown]
[im 4/97]
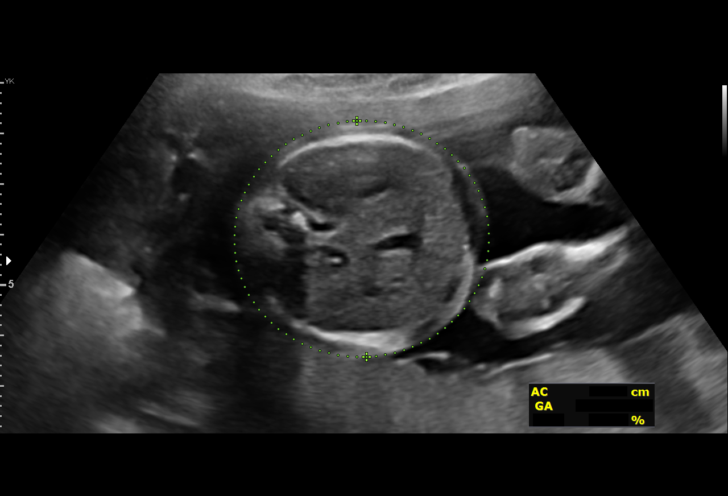
[im 11/97]
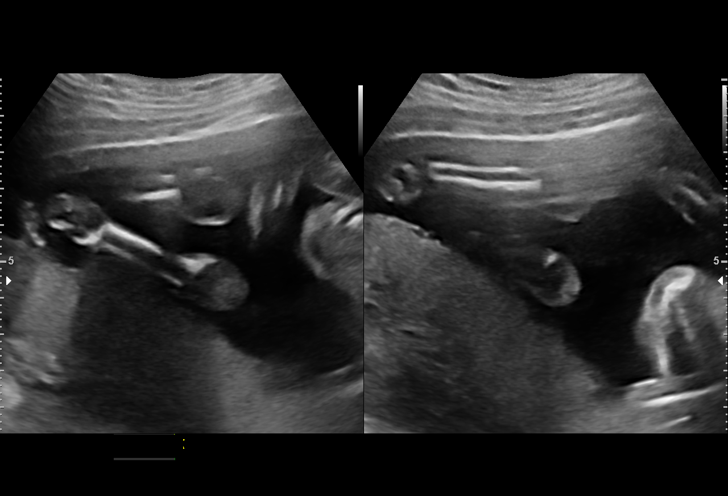
[im 18/97]
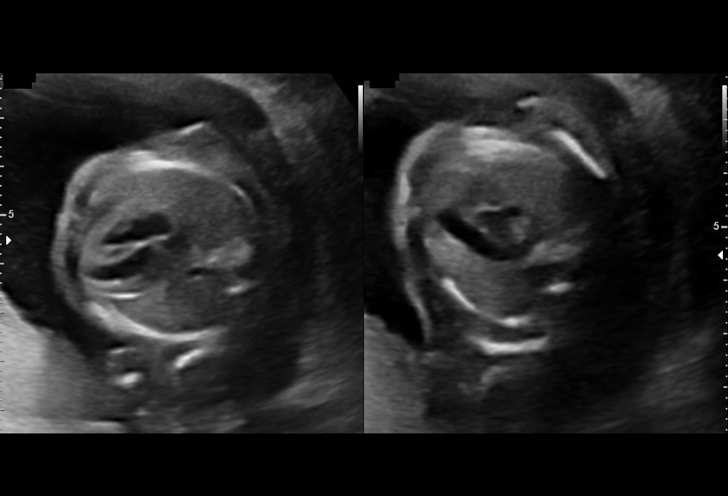
[im 25/97]
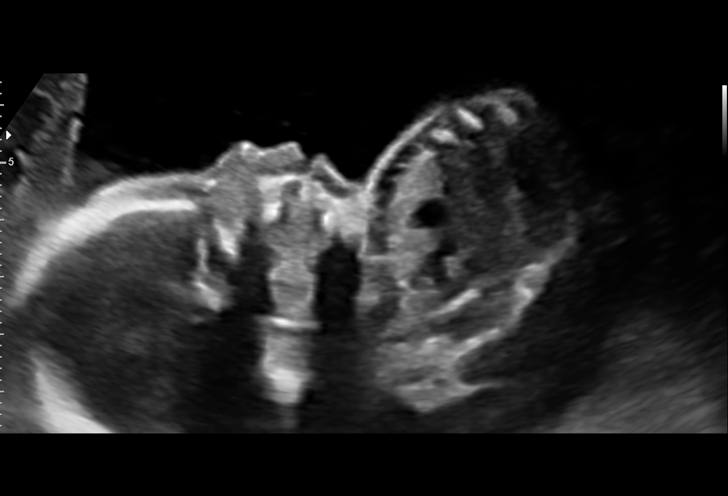
[im 33/97]
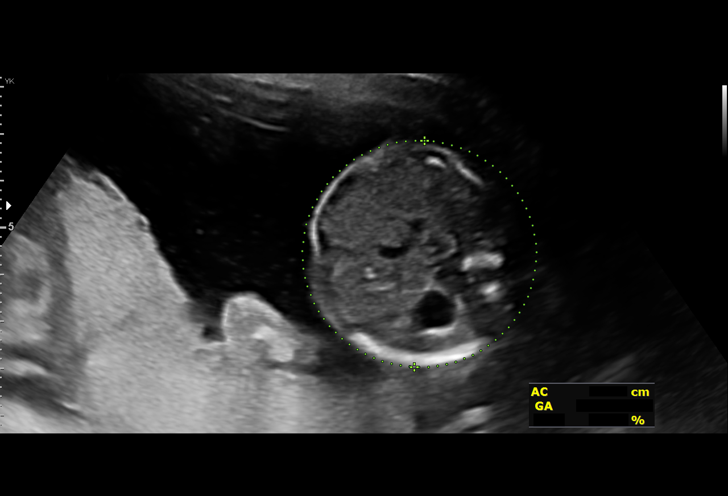
[im 40/97]
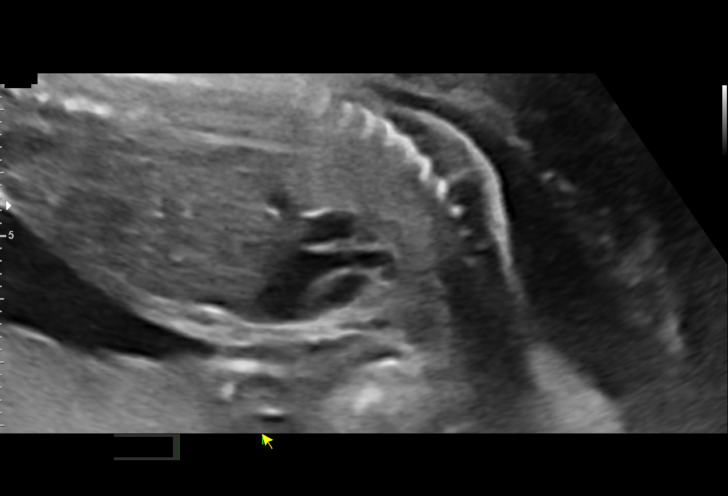
[im 50/97]
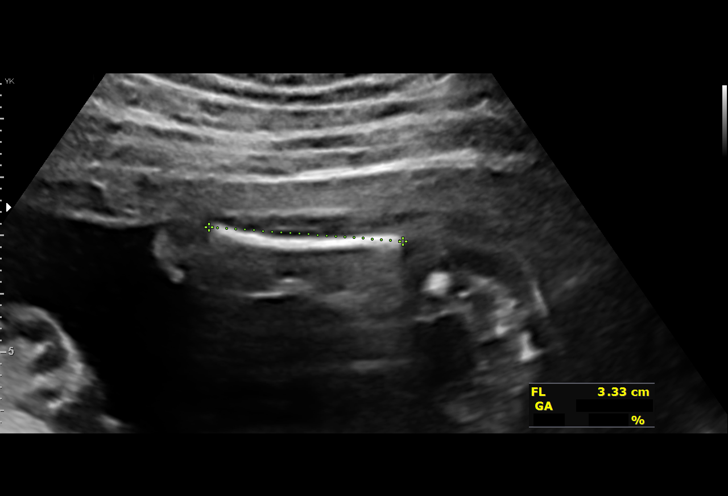
[im 57/97]
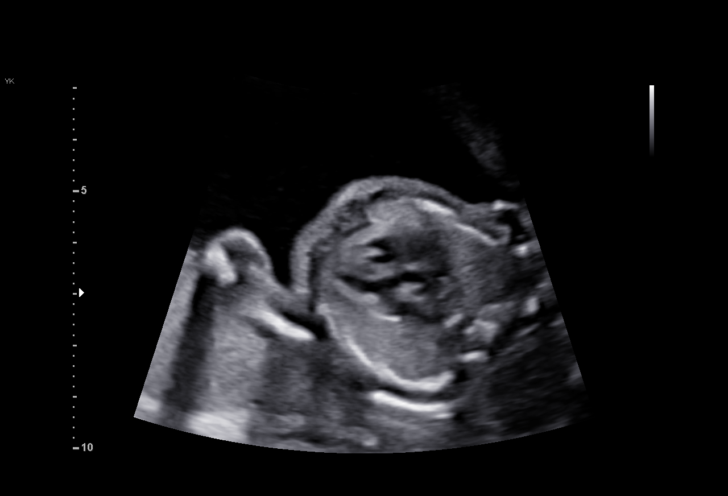
[im 65/97]
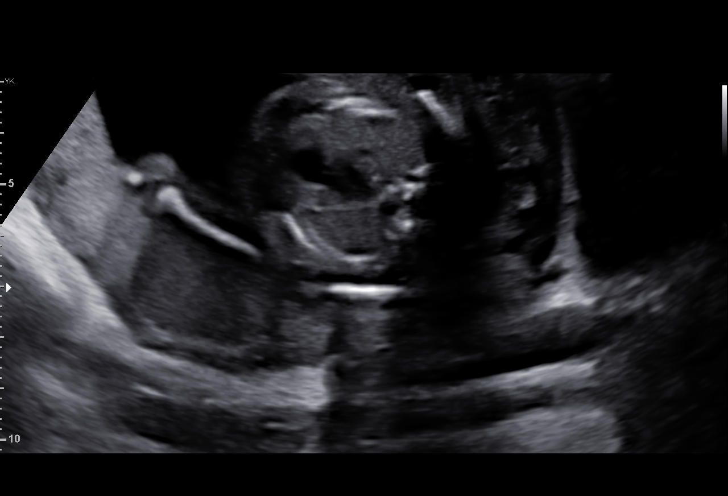
[im 72/97]
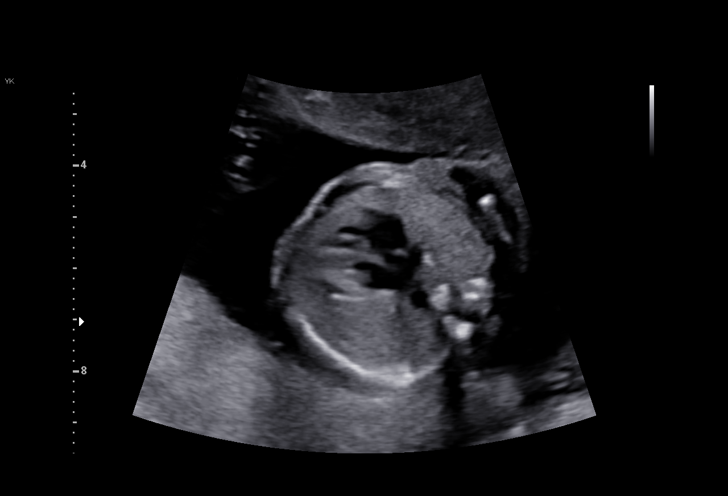
[im 79/97]
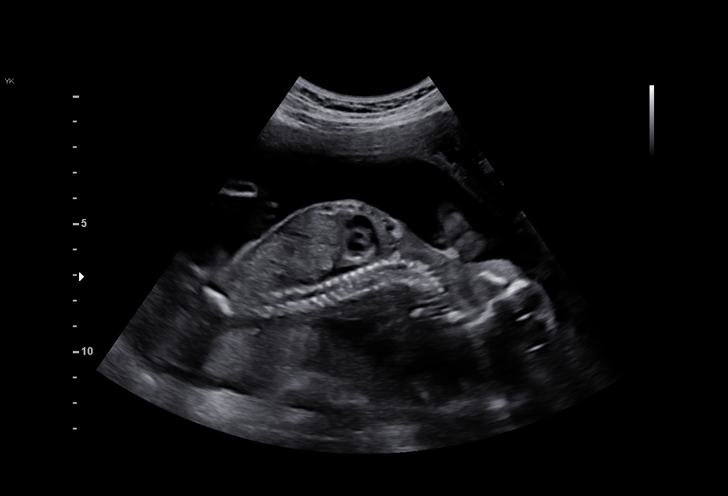
[im 86/97]
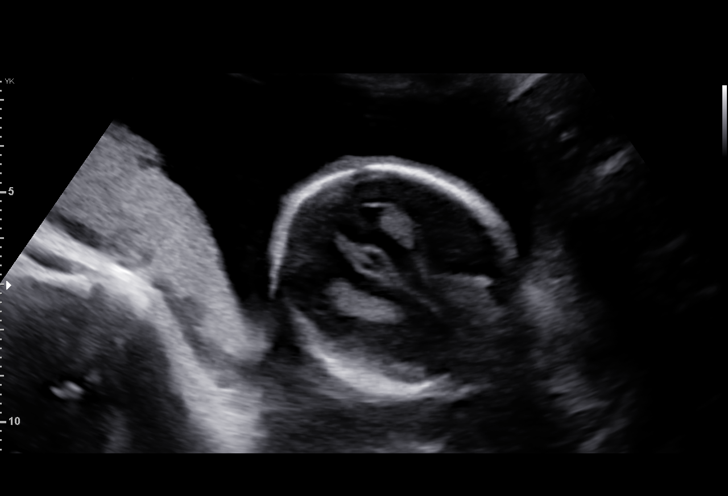
[im 93/97]
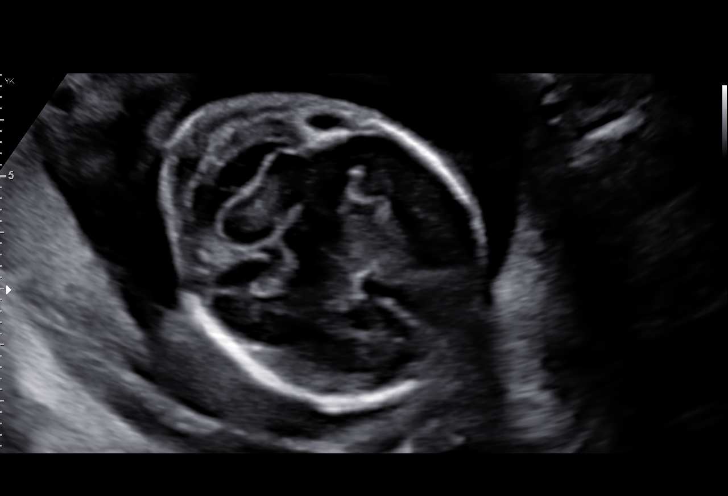

[13 of 28 positions shown; findings below may reference images not displayed]

1  US MFM OB COMP + 14 WK                76805.01    CARLOS DANIEL CREECH

Indications

 Teen pregnancy
 Encounter for antenatal screening for
 malformations
 NIPS:LR, Neg [place]
 20 weeks gestation of pregnancy
Fetal Evaluation

 Num Of Fetuses:         1
 Fetal Heart Rate(bpm):  143
 Cardiac Activity:       Observed
 Presentation:           Cephalic
 Placenta:               Anterior
 P. Cord Insertion:      Visualized, central

 Amniotic Fluid
 AFI FV:      Within normal limits

                             Largest Pocket(cm)

Biometry

 BPD:      49.7  mm     G. Age:  21w 0d         74  %    CI:        75.85   %    70 - 86
                                                         FL/HC:      18.2   %    16.8 -
 HC:      180.9  mm     G. Age:  20w 4d         45  %    HC/AC:      1.19        1.09 -
 AC:      152.1  mm     G. Age:  20w 3d         44  %    FL/BPD:     66.2   %
 FL:       32.9  mm     G. Age:  20w 2d         36  %    FL/AC:      21.6   %    20 - 24

 Est. FW:     351  gm    0 lb 12 oz      43  %
OB History
 Gravidity:    1         Term:   0        Prem:   0        SAB:   0
 TOP:          0       Ectopic:  0        Living: 0
Gestational Age

 LMP:           21w 1d        Date:  06/23/21                 EDD:   03/30/22
 U/S Today:     20w 4d                                        EDD:   04/03/22
 Best:          20w 3d     Det. By:  U/S C R L  (08/17/21)    EDD:   04/04/22
Anatomy

 Cranium:               Appears normal         LVOT:                   Appears normal
 Cavum:                 Appears normal         Aortic Arch:            Appears normal
 Ventricles:            Appears normal         Ductal Arch:            Appears normal
 Choroid Plexus:        Appears normal         Diaphragm:              Appears normal
 Cerebellum:            Appears normal         Stomach:                Appears normal, left
                                                                       sided
 Posterior Fossa:       Appears normal         Abdomen:                Appears normal
 Nuchal Fold:           Not applicable (>20    Abdominal Wall:         Appears nml (cord
                        wks GA)                                        insert, abd wall)
 Face:                  Appears normal         Cord Vessels:           Appears normal (3
                        (orbits and profile)                           vessel cord)
 Lips:                  Appears normal         Kidneys:                Appear normal
 Palate:                Appears normal         Bladder:                Appears normal
 Thoracic:              Appears normal         Spine:                  Appears normal
 Heart:                 Appears normal         Upper Extremities:      Appears normal
                        (4CH, axis, and
                        situs)
 RVOT:                  Appears normal         Lower Extremities:      Appears normal
Cervix Uterus Adnexa

 Cervix
 Length:              3  cm.
 Normal appearance by transabdominal scan.

 Uterus
 No abnormality visualized.

 Right Ovary
 Within normal limits.

 Left Ovary
 Within normal limits.

 Cul De Sac
 No free fluid seen.

 Adnexa
 No abnormality visualized.
Comments

 This patient was seen for a detailed fetal anatomy scan.
 She denies any significant past medical history and denies
 any problems in her current pregnancy.
 She had a cell free DNA test earlier in her pregnancy which
 indicated a low risk for trisomy 21, 18, and 13. A female fetus
 is predicted.
 She was informed that the fetal growth and amniotic fluid
 level were appropriate for her gestational age.
 There were no obvious fetal anomalies noted on today's
 ultrasound exam.
 The patient was informed that anomalies may be missed due
 to technical limitations. If the fetus is in a suboptimal position
 or maternal habitus is increased, visualization of the fetus in
 the maternal uterus may be impaired.
 Follow up as indicated.

## 2022-05-03 ENCOUNTER — Encounter: Payer: Self-pay | Admitting: Obstetrics and Gynecology

## 2022-05-03 ENCOUNTER — Ambulatory Visit (INDEPENDENT_AMBULATORY_CARE_PROVIDER_SITE_OTHER): Payer: Medicaid Other | Admitting: Obstetrics and Gynecology

## 2022-05-03 NOTE — Progress Notes (Signed)
    Post Partum Visit Note  Hannah York is a 21 y.o. G1P1001 s/p SVD/intact perineum on 7/7 at 39.2 weeks.  Anesthesia: epidural. Postpartum course has been uncomplicated. Baby is doing well. Baby is feeding by breast. Bleeding no bleeding. Bowel function is normal. Bladder function is normal. Patient is not sexually active. Contraception method is abstinence. Postpartum depression screening: negative.     Edinburgh Postnatal Depression Scale - 05/03/22 1134       Edinburgh Postnatal Depression Scale:  In the Past 7 Days   I have been able to laugh and see the funny side of things. 0    I have looked forward with enjoyment to things. 0    I have blamed myself unnecessarily when things went wrong. 0    I have been anxious or worried for no good reason. 0    I have felt scared or panicky for no good reason. 0    Things have been getting on top of me. 0    I have been so unhappy that I have had difficulty sleeping. 0    I have felt sad or miserable. 0    I have been so unhappy that I have been crying. 0    The thought of harming myself has occurred to me. 0    Edinburgh Postnatal Depression Scale Total 0             Review of Systems A comprehensive review of systems was negative.  Objective:  BP 124/77   Pulse 70   Wt 144 lb (65.3 kg)   Breastfeeding Yes   BMI 23.96 kg/m    General: NAD Assessment:   Normal postpartum visit.   Plan:  *PP: patient doing great. Patient has used depo and nexplanon in the past; she is unsure of what to do. D/w her re: lactational amenorrhea for first six months PP.   D/w her re: paps starting at age 4  RTC mid 2024 for pap  Cruger Bing, MD [facility] [employer], [facility]

## 2022-05-20 DIAGNOSIS — Z419 Encounter for procedure for purposes other than remedying health state, unspecified: Secondary | ICD-10-CM | POA: Diagnosis not present

## 2022-06-19 DIAGNOSIS — Z419 Encounter for procedure for purposes other than remedying health state, unspecified: Secondary | ICD-10-CM | POA: Diagnosis not present

## 2022-07-20 DIAGNOSIS — Z419 Encounter for procedure for purposes other than remedying health state, unspecified: Secondary | ICD-10-CM | POA: Diagnosis not present

## 2022-08-19 DIAGNOSIS — Z419 Encounter for procedure for purposes other than remedying health state, unspecified: Secondary | ICD-10-CM | POA: Diagnosis not present

## 2022-09-19 DIAGNOSIS — Z419 Encounter for procedure for purposes other than remedying health state, unspecified: Secondary | ICD-10-CM | POA: Diagnosis not present

## 2022-10-20 DIAGNOSIS — Z419 Encounter for procedure for purposes other than remedying health state, unspecified: Secondary | ICD-10-CM | POA: Diagnosis not present

## 2022-11-18 DIAGNOSIS — Z419 Encounter for procedure for purposes other than remedying health state, unspecified: Secondary | ICD-10-CM | POA: Diagnosis not present

## 2022-12-19 DIAGNOSIS — Z419 Encounter for procedure for purposes other than remedying health state, unspecified: Secondary | ICD-10-CM | POA: Diagnosis not present

## 2023-01-18 DIAGNOSIS — Z419 Encounter for procedure for purposes other than remedying health state, unspecified: Secondary | ICD-10-CM | POA: Diagnosis not present

## 2023-02-18 DIAGNOSIS — Z419 Encounter for procedure for purposes other than remedying health state, unspecified: Secondary | ICD-10-CM | POA: Diagnosis not present

## 2023-03-20 DIAGNOSIS — Z419 Encounter for procedure for purposes other than remedying health state, unspecified: Secondary | ICD-10-CM | POA: Diagnosis not present

## 2023-04-20 DIAGNOSIS — Z419 Encounter for procedure for purposes other than remedying health state, unspecified: Secondary | ICD-10-CM | POA: Diagnosis not present

## 2023-05-07 DIAGNOSIS — R070 Pain in throat: Secondary | ICD-10-CM | POA: Diagnosis not present

## 2023-05-07 DIAGNOSIS — R059 Cough, unspecified: Secondary | ICD-10-CM | POA: Diagnosis not present

## 2023-05-07 DIAGNOSIS — J069 Acute upper respiratory infection, unspecified: Secondary | ICD-10-CM | POA: Diagnosis not present

## 2023-05-07 DIAGNOSIS — Z20822 Contact with and (suspected) exposure to covid-19: Secondary | ICD-10-CM | POA: Diagnosis not present

## 2023-05-21 DIAGNOSIS — Z419 Encounter for procedure for purposes other than remedying health state, unspecified: Secondary | ICD-10-CM | POA: Diagnosis not present

## 2023-06-20 DIAGNOSIS — Z419 Encounter for procedure for purposes other than remedying health state, unspecified: Secondary | ICD-10-CM | POA: Diagnosis not present

## 2023-07-21 DIAGNOSIS — Z419 Encounter for procedure for purposes other than remedying health state, unspecified: Secondary | ICD-10-CM | POA: Diagnosis not present

## 2023-07-24 ENCOUNTER — Ambulatory Visit: Payer: Self-pay | Admitting: *Deleted

## 2023-07-24 NOTE — Telephone Encounter (Signed)
Message from Sheridan M sent at 07/24/2023 10:22 AM EST  Summary: loosing weight   Pt stated she is losing a lot of weight fast and she is eating well. Seeking clinical advice.  Pt scheduled for New Patient appointment Tuesday August 01, 2023 10:30 AM EST at [facility] at [facility]          Call History  Contact Date/Time Type Contact Phone/Fax User  07/24/2023 10:21 AM EST Phone (Incoming) York, Hannah Seaberry (Self) [phone] Judie Petit) McGill, Alondra   Reason for Disposition  [1] Continued weight loss AND [2] after medical evaluation by doctor (or NP/PA)    Losing weight in spite of eating good.   Has a new pt appt. Set up for 08/01/2023.  Answer Assessment - Initial Assessment Questions 1. MAIN CONCERN: "What is your main concern today?"     She has a new pt appt with [facility] at [facility] practice on 08/01/2023 at 10:30 AM.   There was a message to call pt regarding losing weight. I returned her call.   She is concerned that she is eating and drinking fine but still losing weight.  I let her know that it sounds like she may need some blood work done and testing with the new dr. She is set up to see to find out what is causing the weight loss.   She was agreeable to this and thanked me for returning the call. 2. WEIGHT LOSS: "How much weight have you lost?"  (e.g., lbs., kgs.)  "Over what period of time have you lost this weight?"  (e.g., number of days, weeks, months, years)     She is eating good but still losing weight.   A complete triage not done since she is set up with an appt. On 08/01/2023 to have this evaluated.  3. BASELINE WEIGHT: "What is your baseline or normal weight?" (e.g., "How much do you usually weigh?")     Not asked 4. CAUSE: "What do you think is causing the weight loss?" (e.g., depression, anxiety, medicine side effect, pain, trouble swallowing, substance or alcohol use problem,  eating disorder)     She doesn't know. 5. PRIOR EVALUATION: "Have you been evaluated by a doctor for your weight loss?" If Yes, ask "When was your last visit?" "What did your doctor (or NP/PA) tell you about the possible cause?"     Not asked 6. HEART FAILURE TREATMENT: "Do you have heart failure?" If Yes, ask: "Have you taken new or extra water pills (diuretics) recently?" (e.g., furosemide; bumetanide). "What is your target weight?"     Not asked 7. OTHER SYMPTOMS: "Do you have any other symptoms?" (e.g., anxiety or depression, blood in stool, breathing difficulty, diarrhea, fever, trouble swallowing)     Not asked 8. PREGNANCY: "Is there any chance you are pregnant?" "When was your last menstrual period?"     Not asked  Protocols used: Weight Loss - Unintended-A-AH

## 2023-07-24 NOTE — Telephone Encounter (Signed)
  Chief Complaint: Losing a lot of weight even though she is eating good.   Has appt set up for 08/01/2023.   Did not do a complete triage.Symptoms: losing weight even though she is eating good Frequency: N/A Pertinent Negatives: Patient denies N/A Disposition: [] ED /[] Urgent Care (no appt availability in office) / [x] Appointment(In office/virtual)/ []  [facility]/ [] Home Care/ [] Refused Recommended Disposition /[] [facility]/ []  Follow-up with PCP Additional Notes: Has a new pt appt. Set up for this and to be established with a new PCP.

## 2023-08-01 ENCOUNTER — Encounter (HOSPITAL_BASED_OUTPATIENT_CLINIC_OR_DEPARTMENT_OTHER): Payer: Self-pay | Admitting: Family Medicine

## 2023-08-01 ENCOUNTER — Ambulatory Visit (INDEPENDENT_AMBULATORY_CARE_PROVIDER_SITE_OTHER): Payer: Medicaid Other | Admitting: Family Medicine

## 2023-08-01 VITALS — BP 122/75 | HR 62 | Temp 98.1°F | Ht 65.0 in | Wt 128.4 lb

## 2023-08-01 DIAGNOSIS — D649 Anemia, unspecified: Secondary | ICD-10-CM

## 2023-08-01 DIAGNOSIS — R634 Abnormal weight loss: Secondary | ICD-10-CM

## 2023-08-01 NOTE — Progress Notes (Signed)
New Patient Office Visit  Subjective:   Hannah York [birth year] 08/01/2023  Chief Complaint  Patient presents with   Establish Care    HPI: Hannah York presents today to establish care at Primary Care and Sports Medicine at [facility]. Introduced to Publishing rights manager role and practice setting.  All questions answered.   WEIGHT LOSS Patient concerned about unintentional weight loss in the past year. Patient delivered baby in July 2023. Her prepregnancy weight was around 120lbs per chart review. She started drinking 2 Boost protein shakes per day, sometimes 3 to increase weight.  She is not breast-feeding.  She reports that she has been having some irregular periods since pregnancy.  She states at times she is having menstrual period twice per month.  Patient's last menstrual period was 07/20/2023 (exact date).  Patient denies symptoms of depression, anxiety or concerns about mental health.  She states she does have good support system.  She states she has noticed easier bruising to bilateral upper and lower extremities and began having nosebleeds to left nare in the last 2 to 3 weeks.  History of anemia present upon chart review.  Last labs checked in July 2023.  Wt Readings from Last 3 Encounters:  08/01/23 128 lb 6.4 oz (58.2 kg)  05/03/22 144 lb (65.3 kg)  03/25/22 155 lb (70.3 kg)    Patient states she is eating 3 meals Breakfast: Oatmeal, Yogurt, Granola Lunch: Leftovers, Pasta, takeout Dinner: [employer] with meat, fruit and veggies   Duration:  Past Year  Amount of weight loss:  Fevers: no Decreased appetite:  Yes, maybe a little bit Night sweats: no Dysphagia/odynophagia: no Dental abnormalities: no Chest pain: no Shortness of breath: no Cough: no Nausea: no Vomiting: no Diarrhea: no Abdominal pain: no Blood in stool: no Easy bruising/bleeding:  Yes, fairly new to patient  Jaundice: no Polydipsia/polyuria: no Recent illness:  no Depression: no Previous colonoscopy: no  Family hx of cancer: Maternal Great Grandmother had breast cancer   The following portions of the patient's history were reviewed and updated as appropriate: past medical history, past surgical history, family history, social history, allergies, medications, and problem list.   There are no problems to display for this patient.  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   Past Surgical History:  Procedure Laterality Date   NO PAST SURGERIES     Family History  Problem Relation Age of Onset   Healthy Mother    Schizophrenia Father    Asthma Brother    Diabetes Maternal Grandmother    Hypertension Maternal Grandmother    Alzheimer's disease Maternal Grandfather    Social History   Socioeconomic History   Marital status: Single    Spouse name: Education administrator   Number of children: Not on file   Years of education: Not on file   Highest education level: Not on file  Occupational History   Occupation: Research scientist (physical sciences)    Comment: [employer]  Tobacco Use   Smoking status: Never   Smokeless tobacco: Never  Vaping Use   Vaping status: Never Used  Substance and Sexual Activity   Alcohol use: Never   Drug use: Never   Sexual activity: Yes    Birth control/protection: None  Other Topics Concern   Not on file  Social History Narrative   Lives with [place]  other.   Social Determinants of Health   Financial Resource Strain: Not on file  Food Insecurity: Not on file  Transportation Needs: Not on file  Physical Activity: Not on file  Stress: No Stress Concern Present (12/07/2018)   [employer] of Occupational Health - Occupational Stress Questionnaire    Feeling of Stress : Not at all  Social Connections: Not on file  Intimate Partner Violence: Not on file   Outpatient Medications Prior to Visit   Medication Sig Dispense Refill   acetaminophen (TYLENOL) 325 MG tablet Take 2 tablets (650 mg total) by mouth every 4 (four) hours as needed (for pain scale < 4). (Patient not taking: Reported on 04/04/2022) 60 tablet 0   ferrous sulfate (FERROUSUL) 325 (65 FE) MG tablet Take 1 tablet (325 mg total) by mouth 2 (two) times daily. (Patient not taking: Reported on 04/04/2022) 60 tablet 3   ibuprofen (ADVIL) 600 MG tablet Take 1 tablet (600 mg total) by mouth every 6 (six) hours. (Patient not taking: Reported on 04/04/2022) 30 tablet 0   Prenatal Vit-Fe Fumarate-FA (PRENATAL MULTIVITAMIN) TABS tablet Take 1 tablet by mouth daily at 12 noon. (Patient not taking: Reported on 08/01/2023)     No facility-administered medications prior to visit.   No Known Allergies  ROS: A complete ROS was performed with pertinent positives/negatives noted in the HPI. The remainder of the ROS are negative.   Objective:   Today's Vitals   08/01/23 1044  BP: 122/75  Pulse: 62  Temp: 98.1 F (36.7 C)  TempSrc: Oral  SpO2: 100%  Weight: 128 lb 6.4 oz (58.2 kg)  Height: 5\' 5"  (1.651 m)  PainSc: 0-No pain    GENERAL: Well-appearing, in NAD. Well nourished.  SKIN: Pink, warm and dry. No rash, lesion, ulceration, or ecchymoses.  Head: Normocephalic. NECK: Trachea midline. Full ROM w/o pain or tenderness. No lymphadenopathy.  EARS: Tympanic membranes are intact, translucent without bulging and without drainage. Appropriate landmarks visualized.  EYES: Conjunctiva clear without exudates. EOMI, PERRL, no drainage present.  NOSE: Septum midline w/o deformity. Nares patent, mucosa pink and non-inflamed w/o drainage. No sinus tenderness.  THROAT: Uvula midline. Oropharynx clear. Mucous membranes pink and moist.  RESPIRATORY: Chest wall symmetrical. Respirations even and non-labored. Breath sounds clear to auscultation bilaterally.  CARDIAC: S1, S2 present, regular rate and rhythm without murmur or gallops. Peripheral  pulses 2+ bilaterally.  MSK: Muscle tone and strength appropriate for age.  EXTREMITIES: Without clubbing, cyanosis, or edema.  NEUROLOGIC: No motor or sensory deficits. Steady, even gait. C2-C12 intact.  PSYCH/MENTAL STATUS: Alert, oriented x 3. Cooperative, appropriate mood and affect.      Assessment & Plan:  1. Unintentional weight loss of more than 10 pounds Weight loss may be due to prior pregnancy weight gain of approximately 38 pounds and patient returning to prepregnancy weight baseline.  Discussed this with patient and we will rule out possible causes with the following lab work.  Patient will follow-up in approximately 2 weeks.  We discussed increasing nutrition with high sources of protein, increase in carbohydrates as needed as well.  - CBC with Differential/Platelet - Comprehensive metabolic panel - Lipid panel - TSH - Hepatitis C antibody - HIV Antibody (routine testing w rflx) - Iron, TIBC and Ferritin Panel - Hemoglobin A1c - Sedimentation rate - T3, free - T4, free  2. Anemia, unspecified type Will repeat CBC, iron panel given previous history of anemia and provide replacement if needed. - CBC with Differential/Platelet - Iron,  TIBC and Ferritin Panel  Patient to reach out to office if new, worrisome, or unresolved symptoms arise or if no improvement in patient's condition. Patient verbalized understanding and is agreeable to treatment plan. All questions answered to patient's satisfaction.    Return in about 2 weeks (around 08/15/2023) for Follow up Weight Loss, Pap and AE .    Yolanda Manges, FNP

## 2023-08-02 LAB — T3, FREE: T3, Free: 3.1 pg/mL (ref 2.0–4.4)

## 2023-08-02 LAB — CBC WITH DIFFERENTIAL/PLATELET
Basophils Absolute: 0 10*3/uL (ref 0.0–0.2)
Basos: 0 %
EOS (ABSOLUTE): 0.2 10*3/uL (ref 0.0–0.4)
Eos: 4 %
Hematocrit: 37 % (ref 34.0–46.6)
Hemoglobin: 12.2 g/dL (ref 11.1–15.9)
Immature Grans (Abs): 0 10*3/uL (ref 0.0–0.1)
Immature Granulocytes: 0 %
Lymphocytes Absolute: 1.7 10*3/uL (ref 0.7–3.1)
Lymphs: 30 %
MCH: 29.1 pg (ref 26.6–33.0)
MCHC: 33 g/dL (ref 31.5–35.7)
MCV: 88 fL (ref 79–97)
Monocytes Absolute: 0.5 10*3/uL (ref 0.1–0.9)
Monocytes: 9 %
Neutrophils Absolute: 3.2 10*3/uL (ref 1.4–7.0)
Neutrophils: 57 %
Platelets: 221 10*3/uL (ref 150–450)
RBC: 4.19 x10E6/uL (ref 3.77–5.28)
RDW: 13.2 % (ref 11.7–15.4)
WBC: 5.8 10*3/uL (ref 3.4–10.8)

## 2023-08-02 LAB — COMPREHENSIVE METABOLIC PANEL
ALT: 12 [IU]/L (ref 0–32)
AST: 16 [IU]/L (ref 0–40)
Albumin: 4.1 g/dL (ref 4.0–5.0)
Alkaline Phosphatase: 71 [IU]/L (ref 44–121)
BUN/Creatinine Ratio: 16 (ref 9–23)
BUN: 10 mg/dL (ref 6–20)
Bilirubin Total: 0.3 mg/dL (ref 0.0–1.2)
CO2: 24 mmol/L (ref 20–29)
Calcium: 9.5 mg/dL (ref 8.7–10.2)
Chloride: 102 mmol/L (ref 96–106)
Creatinine, Ser: 0.63 mg/dL (ref 0.57–1.00)
Globulin, Total: 2.5 g/dL (ref 1.5–4.5)
Glucose: 88 mg/dL (ref 70–99)
Potassium: 4.3 mmol/L (ref 3.5–5.2)
Sodium: 139 mmol/L (ref 134–144)
Total Protein: 6.6 g/dL (ref 6.0–8.5)
eGFR: 129 mL/min/{1.73_m2} (ref >59)

## 2023-08-02 LAB — IRON,TIBC AND FERRITIN PANEL
Ferritin: 17 ng/mL (ref 15–150)
Iron Saturation: 20 % (ref 15–55)
Iron: 66 ug/dL (ref 27–159)
Total Iron Binding Capacity: 332 ug/dL (ref 250–450)
UIBC: 266 ug/dL (ref 131–425)

## 2023-08-02 LAB — LIPID PANEL
Chol/HDL Ratio: 1.9 ratio (ref 0.0–4.4)
Cholesterol, Total: 138 mg/dL (ref 100–199)
HDL: 71 mg/dL (ref >39)
LDL Chol Calc (NIH): 58 mg/dL (ref 0–99)
Triglycerides: 31 mg/dL (ref 0–149)
VLDL Cholesterol Cal: 9 mg/dL (ref 5–40)

## 2023-08-02 LAB — HEMOGLOBIN A1C
Est. average glucose Bld gHb Est-mCnc: 114 mg/dL
Hgb A1c MFr Bld: 5.6 % (ref 4.8–5.6)

## 2023-08-02 LAB — SEDIMENTATION RATE: Sed Rate: 8 mm/h (ref 0–32)

## 2023-08-02 LAB — TSH: TSH: 0.717 u[IU]/mL (ref 0.450–4.500)

## 2023-08-02 LAB — T4, FREE: Free T4: 1.21 ng/dL (ref 0.82–1.77)

## 2023-08-02 LAB — HIV ANTIBODY (ROUTINE TESTING W REFLEX): HIV Screen 4th Generation wRfx: NONREACTIVE

## 2023-08-02 LAB — HEPATITIS C ANTIBODY: Hep C Virus Ab: NONREACTIVE

## 2023-08-02 NOTE — Progress Notes (Signed)
Hi Hannah York,  Your lab work looks great.  Your anemia has improved and is back to normal.  There were no signs of any type of prediabetes, electrolyte issues, kidney or liver issues.  Your cholesterol is excellent.  Your thyroid was normal.  Hepatitis C and HIV were normal.  I believe with review of your prepregnancy weight that your body is returning to the level of where your prepregnancy weight was in the 120s.  I would continue to eat a well-balanced diet with protein, carbohydrates, veggies and fruits and monitor your weight.  When you return for your annual we will review these and if indicated perform any further test.  If you have any questions please let me know

## 2023-08-15 ENCOUNTER — Ambulatory Visit (INDEPENDENT_AMBULATORY_CARE_PROVIDER_SITE_OTHER): Payer: Medicaid Other | Admitting: Family Medicine

## 2023-08-15 ENCOUNTER — Encounter (HOSPITAL_BASED_OUTPATIENT_CLINIC_OR_DEPARTMENT_OTHER): Payer: Self-pay | Admitting: Family Medicine

## 2023-08-15 ENCOUNTER — Other Ambulatory Visit (HOSPITAL_COMMUNITY)
Admission: RE | Admit: 2023-08-15 | Discharge: 2023-08-15 | Disposition: A | Discharge status: Home / Self Care | Payer: Medicaid Other | Source: Ambulatory Visit | Attending: Family Medicine | Admitting: Family Medicine

## 2023-08-15 ENCOUNTER — Ambulatory Visit (HOSPITAL_BASED_OUTPATIENT_CLINIC_OR_DEPARTMENT_OTHER): Payer: Medicaid Other | Admitting: Family Medicine

## 2023-08-15 VITALS — BP 112/58 | HR 82 | Ht 65.0 in | Wt 131.8 lb

## 2023-08-15 DIAGNOSIS — Z124 Encounter for screening for malignant neoplasm of cervix: Secondary | ICD-10-CM | POA: Diagnosis not present

## 2023-08-15 DIAGNOSIS — R634 Abnormal weight loss: Secondary | ICD-10-CM | POA: Diagnosis not present

## 2023-08-15 DIAGNOSIS — Z0000 Encounter for general adult medical examination without abnormal findings: Secondary | ICD-10-CM | POA: Diagnosis not present

## 2023-08-15 NOTE — Patient Instructions (Signed)

## 2023-08-15 NOTE — Progress Notes (Signed)
Subjective:   Hannah York 12/07/2000  08/15/2023   CC: Chief Complaint  Patient presents with   Medical Management of Chronic Issues    2-week follow up; patient denies any concerns for today's visit.    HPI: PEARL CLAMP is a 22 y.o. female who presents for a routine health maintenance exam.  Labs collected at time of visit.   HEALTH SCREENINGS: - Vision Screening:  Recommended - Dental Visits: up to date - Pap smear: pap done - Breast Exam:  Completed today - STD Screening: Declined - Mammogram (40+): Not applicable  - Colonoscopy (45+): Not applicable  - Bone Density (65+ or under 65 with predisposing conditions): Not applicable  - Lung CA screening with low-dose CT:  Not applicable Adults age 21-80 who are current cigarette smokers or quit within the last 15 years. Must have 20 pack year history.   Depression and Anxiety Screen done today and results listed below:     08/15/2023    2:21 PM 08/01/2023   10:45 AM 03/03/2022   11:26 AM 01/06/2022    8:42 AM 08/17/2021    2:59 PM  Depression screen PHQ 2/9  Decreased Interest 0 0 0 0 0  Down, Depressed, Hopeless 0 0 0 0 0  PHQ - 2 Score 0 0 0 0 0  Altered sleeping 0 0 0 0 0  Tired, decreased energy 0 0 0 0 0  Change in appetite 0 0 0 0 0  Feeling bad or failure about yourself  0 0 0 0 0  Trouble concentrating 0 0 0 0 0  Moving slowly or fidgety/restless 0 0 0 0 0  Suicidal thoughts 0 0 0 0 0  PHQ-9 Score 0 0 0 0 0  Difficult doing work/chores Not difficult at all Not difficult at all Not difficult at all        08/15/2023    2:21 PM 03/03/2022   11:26 AM 01/06/2022    8:43 AM 08/17/2021    2:59 PM  GAD 7 : Generalized Anxiety Score  Nervous, Anxious, on Edge 0 0 0 0  Control/stop worrying 0 0 0 0  Worry too much - different things 0 0 0 0  Trouble relaxing 0 0 0 0  Restless 0 0 0 0  Easily annoyed or irritable 0 0 0 0  Afraid - awful might happen 0 0 0 0  Total GAD 7 Score 0 0 0 0  Anxiety  Difficulty Not difficult at all Not difficult at all      IMMUNIZATIONS: - Tdap: Tetanus vaccination status reviewed: last tetanus booster within 10 years. - HPV: Up to date - Influenza: Refused - Pneumovax: Not applicable - Prevnar 20: Not applicable - Shingrix (50+): Not applicable   Past medical history, surgical history, medications, allergies, family history and social history reviewed with patient today and changes made to appropriate areas of the chart.   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   No current facility-administered medications on file prior to visit.    No Known Allergies   Social History   Socioeconomic History   Marital status: Single    Spouse name: Shiela Mayer   Number  of children: Not on file   Years of education: Not on file   Highest education level: Not on file  Occupational History   Occupation: Research scientist (physical sciences)    Comment: [employer]  Tobacco Use   Smoking status: Never   Smokeless tobacco: Never  Vaping Use   Vaping status: Never Used  Substance and Sexual Activity   Alcohol use: Never   Drug use: Never   Sexual activity: Yes    Birth control/protection: None  Other Topics Concern   Not on file  Social History Narrative   Lives with Sig other.   Social Determinants of Health   Financial Resource Strain: Low Risk  (08/15/2023)   Overall Financial Resource Strain (CARDIA)    Difficulty of Paying Living Expenses: Not very hard  Food Insecurity: No Food Insecurity (08/15/2023)   Hunger Vital Sign    Worried About Running Out of Food in the Last Year: Never true    Ran Out of Food in the Last Year: Never true  Transportation Needs: No Transportation Needs  (08/15/2023)   PRAPARE - Administrator, Civil Service (Medical): No    Lack of Transportation (Non-Medical): No  Physical Activity: Sufficiently Active (08/15/2023)   Exercise Vital Sign    Days of Exercise per Week: 4 days    Minutes of Exercise per Session: 90 min  Stress: No Stress Concern Present (08/15/2023)   [employer] of Occupational Health - Occupational Stress Questionnaire    Feeling of Stress : Not at all  Social Connections: Moderately Isolated (08/15/2023)   Social Connection and Isolation Panel [NHANES]    Frequency of Communication with Friends and Family: More than three times a week    Frequency of Social Gatherings with Friends and Family: Three times a week    Attends Religious Services: Never    Active Member of Clubs or Organizations: No    Attends Banker Meetings: Never    Marital Status: Living with partner  Intimate Partner Violence: Not At Risk (08/15/2023)   Humiliation, Afraid, Rape, and Kick questionnaire    Fear of Current or Ex-Partner: No    Emotionally Abused: No    Physically Abused: No    Sexually Abused: No   Social History   Tobacco Use  Smoking Status Never  Smokeless Tobacco Never   Social History   Substance and Sexual Activity  Alcohol Use Never    Family History  Problem Relation Age of Onset   Healthy Mother    Schizophrenia Father    Asthma Brother    Diabetes Maternal Grandmother    Hypertension Maternal Grandmother    Alzheimer's disease Maternal Grandfather      ROS: Denies fever, fatigue, unexplained weight loss/gain, chest pain, SHOB, and palpitations. Denies neurological deficits, gastrointestinal or genitourinary complaints, and skin changes.   Objective:   Today's Vitals   08/15/23 1416  BP: (!) 112/58  Pulse: 82  SpO2: 99%  Weight: 131 lb 12.8 oz (59.8 kg)  Height: 5\' 5"  (1.651 m)    GENERAL APPEARANCE: Well-appearing, in NAD. Well nourished.  SKIN: Pink, warm and dry.  Turgor normal. No rash, lesion, ulceration, or ecchymoses. Hair evenly distributed.  HEENT: HEAD: Normocephalic.  EYES: PERRLA. EOMI. Lids intact w/o defect. Sclera white, Conjunctiva pink w/o exudate.  EARS: External ear w/o redness, swelling, masses or lesions. EAC clear. TM's intact, translucent w/o bulging, appropriate landmarks visualized. Appropriate acuity to conversational tones.  NOSE: Septum midline w/o deformity. Nares  patent, mucosa pink and non-inflamed w/o drainage. No sinus tenderness.  THROAT: Uvula midline. Oropharynx clear. Tonsils non-inflamed w/o exudate. Oral mucosa pink and moist.  NECK: Supple, Trachea midline. Full ROM w/o pain or tenderness. No lymphadenopathy. Thyroid non-tender w/o enlargement or palpable masses.  BREASTS: Breasts pendulous, symmetrical, and w/o palpable masses. Nipples everted and w/o discharge. No rash or skin retraction. No axillary or supraclavicular lymphadenopathy.  RESPIRATORY: Chest wall symmetrical w/o masses. Respirations even and non-labored. Breath sounds clear to auscultation bilaterally. No wheezes, rales, rhonchi, or crackles. CARDIAC: S1, S2 present, regular rate and rhythm. No gallops, murmurs, rubs, or clicks. PMI w/o lifts, heaves, or thrills. No carotid bruits. Capillary refill <2 seconds. Peripheral pulses 2+ bilaterally. GI: Abdomen soft w/o distention. Normoactive bowel sounds. No palpable masses or tenderness. No guarding or rebound tenderness. Liver and spleen w/o tenderness or enlargement. No CVA tenderness.  GU: External genitalia without erythema, lesions, or masses. No lymphadenopathy. Vaginal mucosa pink and moist without exudate, lesions, or ulcerations. Cervix pink without discharge. Cervical os closed. Uterus and adnexae palpable, not enlarged, and w/o tenderness. No palpable masses.  MSK: Muscle tone and strength appropriate for age, w/o atrophy or abnormal movement.  EXTREMITIES: Active ROM intact, w/o tenderness, crepitus,  or contracture. No obvious joint deformities or effusions. No clubbing, edema, or cyanosis.  NEUROLOGIC: CN's II-XII intact. Motor strength symmetrical with no obvious weakness. No sensory deficits. DTR's 2+ symmetric bilaterally. Steady, even gait.  PSYCH/MENTAL STATUS: Alert, oriented x 3. Cooperative, appropriate mood and affect.   Chaperoned by Cristy Hilts, CMA   Wt Readings from Last 3 Encounters:  08/15/23 131 lb 12.8 oz (59.8 kg)  08/01/23 128 lb 6.4 oz (58.2 kg)  05/03/22 144 lb (65.3 kg)     Results for orders placed or performed in visit on 08/01/23  CBC with Differential/Platelet  Result Value Ref Range   WBC 5.8 3.4 - 10.8 x10E3/uL   RBC 4.19 3.77 - 5.28 x10E6/uL   Hemoglobin 12.2 11.1 - 15.9 g/dL   Hematocrit 10.2 72.5 - 46.6 %   MCV 88 79 - 97 fL   MCH 29.1 26.6 - 33.0 pg   MCHC 33.0 31.5 - 35.7 g/dL   RDW 36.6 44.0 - 34.7 %   Platelets 221 150 - 450 x10E3/uL   Neutrophils 57 Not Estab. %   Lymphs 30 Not Estab. %   Monocytes 9 Not Estab. %   Eos 4 Not Estab. %   Basos 0 Not Estab. %   Neutrophils Absolute 3.2 1.4 - 7.0 x10E3/uL   Lymphocytes Absolute 1.7 0.7 - 3.1 x10E3/uL   Monocytes Absolute 0.5 0.1 - 0.9 x10E3/uL   EOS (ABSOLUTE) 0.2 0.0 - 0.4 x10E3/uL   Basophils Absolute 0.0 0.0 - 0.2 x10E3/uL   Immature Granulocytes 0 Not Estab. %   Immature Grans (Abs) 0.0 0.0 - 0.1 x10E3/uL  Comprehensive metabolic panel  Result Value Ref Range   Glucose 88 70 - 99 mg/dL   BUN 10 6 - 20 mg/dL   Creatinine, Ser 4.25 0.57 - 1.00 mg/dL   eGFR 956 >38 VF/IEP/3.29   BUN/Creatinine Ratio 16 9 - 23   Sodium 139 134 - 144 mmol/L   Potassium 4.3 3.5 - 5.2 mmol/L   Chloride 102 96 - 106 mmol/L   CO2 24 20 - 29 mmol/L   Calcium 9.5 8.7 - 10.2 mg/dL   Total Protein 6.6 6.0 - 8.5 g/dL   Albumin 4.1 4.0 - 5.0 g/dL   Globulin, Total  2.5 1.5 - 4.5 g/dL   Bilirubin Total 0.3 0.0 - 1.2 mg/dL   Alkaline Phosphatase 71 44 - 121 IU/L   AST 16 0 - 40 IU/L   ALT 12 0 - 32 IU/L   Lipid panel  Result Value Ref Range   Cholesterol, Total 138 100 - 199 mg/dL   Triglycerides 31 0 - 149 mg/dL   HDL 71 >78 mg/dL   VLDL Cholesterol Cal 9 5 - 40 mg/dL   LDL Chol Calc (NIH) 58 0 - 99 mg/dL   Chol/HDL Ratio 1.9 0.0 - 4.4 ratio  TSH  Result Value Ref Range   TSH 0.717 0.450 - 4.500 uIU/mL  Hepatitis C antibody  Result Value Ref Range   Hep C Virus Ab Non Reactive Non Reactive  HIV Antibody (routine testing w rflx)  Result Value Ref Range   HIV Screen 4th Generation wRfx Non Reactive Non Reactive  Iron, TIBC and Ferritin Panel  Result Value Ref Range   Total Iron Binding Capacity 332 250 - 450 ug/dL   UIBC 295 621 - 308 ug/dL   Iron 66 27 - 657 ug/dL   Iron Saturation 20 15 - 55 %   Ferritin 17 15 - 150 ng/mL  Hemoglobin A1c  Result Value Ref Range   Hgb A1c MFr Bld 5.6 4.8 - 5.6 %   Est. average glucose Bld gHb Est-mCnc 114 mg/dL  Sedimentation rate  Result Value Ref Range   Sed Rate 8 0 - 32 mm/hr  T3, free  Result Value Ref Range   T3, Free 3.1 2.0 - 4.4 pg/mL  T4, free  Result Value Ref Range   Free T4 1.21 0.82 - 1.77 ng/dL    Assessment & Plan:  1. Annual physical exam Doing well overall. We discussed her lab results in depth, health maintenance, and preventative screenings. Discussed healthy diet and exercise and routine vaccines.  Declined Flu vaccine today   2. Encounter for Papanicolaou smear for cervical cancer screening Tolerated well. PT will be notified of results when available.  - Cytology - PAP  3. Unintentional weight loss of more than 10 pounds Labs are unremarkable. Pt has increased caloric intake, protein and has had weight gain of approx. 4-5lbs since last visit. Will continue to monitor and return if unintentional weight loss occurs.      PATIENT COUNSELING:  - Encouraged a healthy well-balanced diet. Patient may adjust caloric intake to maintain or achieve ideal body weight. May reduce intake of dietary saturated fat and  total fat and have adequate dietary potassium and calcium preferably from fresh fruits, vegetables, and low-fat dairy products.   - Advised to avoid cigarette smoking. - Discussed with the patient that most people either abstain from alcohol or drink within safe limits (<=14/week and <=4 drinks/occasion for males, <=7/weeks and <= 3 drinks/occasion for females) and that the risk for alcohol disorders and other health effects rises proportionally with the number of drinks per week and how often a drinker exceeds daily limits. - Discussed cessation/primary prevention of drug use and availability of treatment for abuse.  - Discussed sexually transmitted diseases, avoidance of unintended pregnancy and contraceptive alternatives.  - Stressed the importance of regular exercise - Injury prevention: Discussed safety belts, safety helmets, smoke detector, smoking near bedding or upholstery.  - Dental health: Discussed importance of regular tooth brushing, flossing, and dental visits.   NEXT PREVENTATIVE PHYSICAL DUE IN 1 YEAR.  Return in about 1 year (around 08/14/2024) for [employer] [facility]	[place]  [facility].  Patient to reach out to office if new, worrisome, or unresolved symptoms arise or if no improvement in patient's condition. Patient verbalized understanding and is agreeable to treatment plan. All questions answered to patient's satisfaction.    Hilbert Bible, [place]

## 2023-08-18 LAB — CYTOLOGY - PAP
Chlamydia: NEGATIVE
Comment: NEGATIVE
Comment: NEGATIVE
Comment: NORMAL
Diagnosis: NEGATIVE
Neisseria Gonorrhea: NEGATIVE
Trichomonas: NEGATIVE

## 2023-08-20 DIAGNOSIS — Z419 Encounter for procedure for purposes other than remedying health state, unspecified: Secondary | ICD-10-CM | POA: Diagnosis not present

## 2023-08-20 NOTE — Progress Notes (Signed)
Pap Smear is normal. Health maintenance updated. Repeat pap in 3 years.

## 2023-09-18 ENCOUNTER — Ambulatory Visit (HOSPITAL_BASED_OUTPATIENT_CLINIC_OR_DEPARTMENT_OTHER): Payer: Self-pay | Admitting: Family Medicine

## 2023-09-18 NOTE — Telephone Encounter (Signed)
Chief Complaint: vaginal bleeding Symptoms: vaginal bleeding, intermittent dizziness, intermittent cramping Frequency: last 20 days Pertinent Negatives: Patient denies fever, abdominal pain Disposition: [] ED /[] Urgent Care (no appt availability in office) / [x] Appointment(In office/virtual)/ []  [facility]/ [] Home Care/ [] Refused Recommended Disposition /[] [facility]/ []  Follow-up with PCP Additional Notes: Patient reports she has been having vaginal bleeding for the past 20 days. Patient reports her period is usually regular and she last had it at the end of November. Patient reports she will occasionally have cramping and has had "a few" episodes of dizziness. Patient denies fever, hx of blood thinners, and abdominal pain. Patient reports she "should not be" pregnant. Per protocol, this RN attempted to schedule in office appt, no availability until tomorrow, so this RN offered UC appt. Patient refused and said she would prefer to be seen in office tomorrow. Appt scheduled 12/31. Patient advised to call back or go to UC with worsening symptoms. Patient verbalized understanding.    Copied from CRM [id number]. Topic: Clinical - Red Word Triage >> Sep 18, 2023  4:12 PM Alvino Blood C wrote: Red Word that prompted transfer to Nurse Triage: Patient has been experiencing heavy bleeding with blood clots Reason for Disposition  MODERATE vaginal bleeding (e.g., soaking 1 pad or tampon per hour and present > 6 hours; 1 menstrual cup every 6 hours)  Answer Assessment - Initial Assessment Questions 1. AMOUNT: "Describe the bleeding that you are having."    - SPOTTING: spotting, or pinkish / brownish mucous discharge; does not fill panty liner or pad    - MILD:  less than 1 pad / hour; less than patient's usual menstrual bleeding   - MODERATE: 1-2 pads / hour; 1 menstrual cup every 6 hours; small-medium blood clots (e.g., pea, grape, small coin)   - SEVERE: soaking 2 or more pads/hour for  2 or more hours; 1 menstrual cup every 2 hours; bleeding not contained by pads or continuous red blood from vagina; large blood clots (e.g., golf ball, large coin)      moderate 2. ONSET: "When did the bleeding begin?" "Is it continuing now?"     20 days ago 3. MENSTRUAL PERIOD: "When was the last normal menstrual period?" "How is this different than your period?"  End of novemeber, stopped on December 5th. 4. REGULARITY: "How regular are your periods?"     regular 5. ABDOMEN PAIN: "Do you have any pain?" "How bad is the pain?"  (e.g., Scale 1-10; mild, moderate, or severe)   - MILD (1-3): doesn't interfere with normal activities, abdomen soft and not tender to touch    - MODERATE (4-7): interferes with normal activities or awakens from sleep, abdomen tender to touch    - SEVERE (8-10): excruciating pain, doubled over, unable to do any normal activities      none 6. PREGNANCY: "Is there any chance you are pregnant?" "When was your last menstrual period?"     no 7. BREASTFEEDING: "Are you breastfeeding?"     no 8. HORMONE MEDICINES: "Are you taking any hormone medicines, prescription or over-the-counter?" (e.g., birth control pills, estrogen)     no 9. BLOOD THINNER MEDICINES: "Do you take any blood thinners?" (e.g., Coumadin / warfarin, Pradaxa / dabigatran, aspirin)     none 10. CAUSE: "What do you think is causing the bleeding?" (e.g., recent gyn surgery, recent gyn procedure; known bleeding disorder, cervical cancer, polycystic ovarian disease, fibroids)         Not sure 11. HEMODYNAMIC  STATUS: "Are you weak or feeling lightheaded?" If Yes, ask: "Can you stand and walk normally?"        Intermittent dizziness 12. OTHER SYMPTOMS: "What other symptoms are you having with the bleeding?" (e.g., passed tissue, vaginal discharge, fever, menstrual-type cramps)       cramps  Protocols used: Vaginal Bleeding - Abnormal-A-AH

## 2023-09-19 ENCOUNTER — Ambulatory Visit (HOSPITAL_BASED_OUTPATIENT_CLINIC_OR_DEPARTMENT_OTHER): Payer: Medicaid Other | Admitting: Family Medicine

## 2023-09-20 DIAGNOSIS — Z419 Encounter for procedure for purposes other than remedying health state, unspecified: Secondary | ICD-10-CM | POA: Diagnosis not present

## 2023-09-21 ENCOUNTER — Other Ambulatory Visit (HOSPITAL_COMMUNITY)
Admission: RE | Admit: 2023-09-21 | Discharge: 2023-09-21 | Disposition: A | Discharge status: Home / Self Care | Payer: Medicaid Other | Source: Ambulatory Visit | Attending: Family Medicine | Admitting: Family Medicine

## 2023-09-21 ENCOUNTER — Encounter (HOSPITAL_BASED_OUTPATIENT_CLINIC_OR_DEPARTMENT_OTHER): Payer: Self-pay | Admitting: Family Medicine

## 2023-09-21 ENCOUNTER — Ambulatory Visit (HOSPITAL_BASED_OUTPATIENT_CLINIC_OR_DEPARTMENT_OTHER): Payer: Medicaid Other | Admitting: Family Medicine

## 2023-09-21 VITALS — BP 121/69 | HR 76 | Ht 65.0 in | Wt 136.0 lb

## 2023-09-21 DIAGNOSIS — N939 Abnormal uterine and vaginal bleeding, unspecified: Secondary | ICD-10-CM

## 2023-09-21 LAB — CERVICOVAGINAL ANCILLARY ONLY
Bacterial Vaginitis (gardnerella): POSITIVE — AB
Candida Glabrata: NEGATIVE
Candida Vaginitis: POSITIVE — AB
Chlamydia: NEGATIVE
Comment: NEGATIVE
Comment: NEGATIVE
Comment: NEGATIVE
Comment: NEGATIVE
Comment: NEGATIVE
Comment: NORMAL
Neisseria Gonorrhea: NEGATIVE
Trichomonas: NEGATIVE

## 2023-09-21 LAB — POCT URINE PREGNANCY: Preg Test, Ur: NEGATIVE

## 2023-09-21 NOTE — Patient Instructions (Signed)
 If period does not return as expected this month, reach out to [place] and we will start birth control.

## 2023-09-21 NOTE — Progress Notes (Signed)
 Subjective:   Hannah York 04/21/2001 09/21/2023  Chief Complaint  Patient presents with   Vaginal Bleeding    Patient states she has had heavy vaginal bleeding for the past 2 weeks.     HPI: Hannah York presents today for re-assessment and management of chronic medical conditions.   ABNORMAL MENSTRUAL CYCLE  Duration: Patient reports one episode of abnormal vaginal bleeding outside of normal menses. Her LMP was November 30th- Dec 4th and states she had normal level of bleeding and length of menses. Reports her periods are very regular every 28-30 days. Reports episode of vaginal bleeding began on Dec. 20th-30th- reports bleeding was very heavy but resolved on Dec. 30th.   Menarche age: 34/14 Length of menses: 4-5 days Average interval between menses: 28-31 days Flow: heavy initially, then lighter  Contraception: None; she was previously on Nexplanon (removed several years ago) and Depo (several years ago)   Sexual activity:  Yes, sexually active prior to Dec. 20th  History of sexually transmitted diseases: no History GYN procedures: no Abnormal pap smears: no    Dysmenorrhea: no Intermenstrual bleeding:yes Dyspareunia: Yes, reports pain present during sex  Postcoital bleeding: yes, reports bleeding after sex prior to intermenstrual bleeding episode Abdominal pain: no Vaginal discharge:no Hirsuitism: no Frequent bruising/mucosal bleeding: no Hot flashes: no   [id number]  The following portions of the patient's history were reviewed and updated as appropriate: past medical history, past surgical history, family history, social history, allergies, medications, and problem list.   There are no active problems to display for this patient.  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   No facility-administered medications prior to visit.   No Known Allergies   ROS: A complete ROS was performed with pertinent positives/negatives noted in the HPI. The remainder of the ROS are negative.    Objective:   Today's Vitals   09/21/23 1100  BP: 121/69  Pulse: 76  SpO2: 100%  Weight: 136 lb (61.7 kg)  Height: 5' 5 (1.651 m)    Physical Exam          GENERAL: Well-appearing, in NAD. Well nourished.  SKIN: Pink, warm and dry. No rash, lesion, ulceration, or ecchymoses.  Head: Normocephalic. NECK: Trachea midline. Full ROM w/o pain or tenderness. RESPIRATORY: Chest wall symmetrical. Respirations even and non-labored.  MSK: Muscle tone and strength appropriate for age. Joints w/o tenderness, redness, or swelling.  GI: Abdomen soft, non-tender. No rebound tenderness. No hepatomegaly or splenomegaly.  GU: External genitalia without erythema, lesions, or masses. No lymphadenopathy. Vaginal mucosa pink and moist without  lesions, or ulcerations. Cervix pink with scant white discharge. Cervical os closed. Uterus and adnexae palpable, not enlarged, and w/o tenderness. No palpable masses. Chaperoned by Damien Many, CMA EXTREMITIES: Without clubbing, cyanosis, or edema.  NEUROLOGIC: No motor or sensory deficits. Steady, even gait. C2-C12 intact.  PSYCH/MENTAL STATUS: Alert, oriented x 3. Cooperative, appropriate mood and affect.   There are no  preventive care reminders to display for this patient.  Results for orders placed or performed in visit on 09/21/23  POCT urine pregnancy  Result Value Ref Range   Preg Test, Ur Negative Negative       Assessment & Plan:   1. Vaginal bleeding (Primary) Urine pregnancy negative in office today and bleeding has resolved.  Pelvic exam did not reveal any abnormal lesions, abdominal tenderness, or palpable masses present.  PCP recommended patient starting oral contraception with combination medication and patient would like to wait and see if her next period is regular.  She will reach out to PCP if abnormal bleeding occurs and it would be agreeable to starting contraception at that time.  We discussed safe sex practices and we will send off vaginal swab for STI, BV and yeast rule out.  No signs or symptoms of anemia.  If abnormal bleeding continues, will recommend ultrasound pelvis as well.  - POCT urine pregnancy - Cervicovaginal ancillary only   Lab Orders         POCT urine pregnancy      Return if symptoms worsen or fail to improve.    Patient to reach out to office if new, worrisome, or unresolved symptoms arise or if no improvement in patient's condition. Patient verbalized understanding and is agreeable to treatment plan. All questions answered to patient's satisfaction.    Thersia Schuyler Stark, [place]

## 2023-09-22 ENCOUNTER — Other Ambulatory Visit (HOSPITAL_BASED_OUTPATIENT_CLINIC_OR_DEPARTMENT_OTHER): Payer: Self-pay | Admitting: Family Medicine

## 2023-09-22 DIAGNOSIS — B3731 Acute candidiasis of vulva and vagina: Secondary | ICD-10-CM

## 2023-09-22 DIAGNOSIS — B9689 Other specified bacterial agents as the cause of diseases classified elsewhere: Secondary | ICD-10-CM

## 2023-09-22 MED ORDER — METRONIDAZOLE 500 MG PO TABS: 500.0000 mg | ORAL_TABLET | Freq: Two times a day (BID) | ORAL | 0 refills | Status: AC

## 2023-09-22 MED ORDER — FLUCONAZOLE 150 MG PO TABS: 150.0000 mg | ORAL_TABLET | Freq: Once | ORAL | 0 refills | Status: AC

## 2023-09-22 NOTE — Progress Notes (Signed)
 You have an overgrowth of bacteria in your vagina called Bacterial Vaginosis. This is not a sexually transmitted infection.  Sexual partners do not need to be treated, however abstaining from sex or using condoms may prevent recurrence of the overgrowth. Some women have a recurrence of the overgrowth even when fully treated.  Call the office if your symptoms begin again.  Do not douche.  This is associated with decreased cure rates and more bacterial overgrowths. Take the full course of the antibiotic prescribed to you even if you begin to feel better. Do not drink alcohol with the antibiotic flagyl  (metronidazole ) as this drug will cause nausea and severe vomiting if you drink while taking antibiotic.   Once you have finished the flagyl  (metronidazole ), please take the Diflucan  as you also had yeast present and this will clear this up for you.

## 2023-10-21 DIAGNOSIS — Z419 Encounter for procedure for purposes other than remedying health state, unspecified: Secondary | ICD-10-CM | POA: Diagnosis not present

## 2023-11-18 DIAGNOSIS — Z419 Encounter for procedure for purposes other than remedying health state, unspecified: Secondary | ICD-10-CM | POA: Diagnosis not present

## 2023-12-30 DIAGNOSIS — Z419 Encounter for procedure for purposes other than remedying health state, unspecified: Secondary | ICD-10-CM | POA: Diagnosis not present

## 2024-01-29 DIAGNOSIS — Z419 Encounter for procedure for purposes other than remedying health state, unspecified: Secondary | ICD-10-CM | POA: Diagnosis not present

## 2024-02-13 ENCOUNTER — Telehealth (HOSPITAL_BASED_OUTPATIENT_CLINIC_OR_DEPARTMENT_OTHER): Payer: Self-pay | Admitting: Family Medicine

## 2024-02-13 NOTE — Telephone Encounter (Signed)
Made appt for patient.  

## 2024-02-13 NOTE — Telephone Encounter (Signed)
 Copied from CRM [id number]. Topic: Appointments - Scheduling Inquiry for Clinic >> Feb 09, 2024  4:32 PM Elle L wrote: Reason for CRM: The patient is requesting an appointment for TB test, and TDAP immunization as it has to be within the last 10 years. She is also requesting her immunization records sent via MyChart as well as to pick up in the office as she needs to have proof of 2 MMR vaccines, 3 Hep B vaccines, and 2 Varicella vaccines. The patient's call back number is [phone]. The patient is requesting an appointment before next Friday if possible.

## 2024-02-15 ENCOUNTER — Encounter (HOSPITAL_BASED_OUTPATIENT_CLINIC_OR_DEPARTMENT_OTHER): Payer: Self-pay | Admitting: *Deleted

## 2024-02-15 ENCOUNTER — Ambulatory Visit (INDEPENDENT_AMBULATORY_CARE_PROVIDER_SITE_OTHER): Admitting: *Deleted

## 2024-02-15 DIAGNOSIS — Z23 Encounter for immunization: Secondary | ICD-10-CM | POA: Diagnosis not present

## 2024-02-15 DIAGNOSIS — Z111 Encounter for screening for respiratory tuberculosis: Secondary | ICD-10-CM | POA: Diagnosis not present

## 2024-02-15 NOTE — Progress Notes (Signed)
 Patient is in office today for a nurse visit for Tdap Immunization. Patient Injection was given in the  Left deltoid. Patient tolerated injection well.  Patient is also needing TB test for school. Due to not being able to come back within 72 hours to have ppd test read, Quantiferon TB Gold blood test was done.  Letter provided to pt for her to take to school stating that the results are in process and that we will give her a copy of the result once they are available. Patient's immunization records were also printed for pt for her to take to school.

## 2024-02-18 LAB — QUANTIFERON-TB GOLD PLUS
QuantiFERON Mitogen Value: >10 [IU]/mL
QuantiFERON Nil Value: 0.14 [IU]/mL
QuantiFERON TB1 Ag Value: 0.06 [IU]/mL
QuantiFERON TB2 Ag Value: 0.03 [IU]/mL
QuantiFERON-TB Gold Plus: NEGATIVE

## 2024-02-19 ENCOUNTER — Ambulatory Visit (HOSPITAL_BASED_OUTPATIENT_CLINIC_OR_DEPARTMENT_OTHER): Payer: Self-pay | Admitting: Family Medicine

## 2024-02-19 NOTE — Progress Notes (Signed)
 Hannah York,  Your TB test is normal. Please provide this for your school form.

## 2024-02-29 DIAGNOSIS — Z419 Encounter for procedure for purposes other than remedying health state, unspecified: Secondary | ICD-10-CM | POA: Diagnosis not present

## 2024-03-30 DIAGNOSIS — Z419 Encounter for procedure for purposes other than remedying health state, unspecified: Secondary | ICD-10-CM | POA: Diagnosis not present

## 2024-04-30 DIAGNOSIS — Z419 Encounter for procedure for purposes other than remedying health state, unspecified: Secondary | ICD-10-CM | POA: Diagnosis not present

## 2024-05-24 DIAGNOSIS — J029 Acute pharyngitis, unspecified: Secondary | ICD-10-CM | POA: Diagnosis not present

## 2024-05-31 DIAGNOSIS — Z419 Encounter for procedure for purposes other than remedying health state, unspecified: Secondary | ICD-10-CM | POA: Diagnosis not present

## 2024-06-30 ENCOUNTER — Emergency Department
Admission: EM | Admit: 2024-06-30 | Discharge: 2024-06-30 | Disposition: A | Discharge status: Home / Self Care | Attending: Emergency Medicine | Admitting: Emergency Medicine

## 2024-06-30 DIAGNOSIS — W232XXA Caught, crushed, jammed or pinched between a moving and stationary object, initial encounter: Secondary | ICD-10-CM | POA: Insufficient documentation

## 2024-06-30 DIAGNOSIS — S61012A Laceration without foreign body of left thumb without damage to nail, initial encounter: Secondary | ICD-10-CM | POA: Diagnosis not present

## 2024-06-30 MED ORDER — LIDOCAINE HCL (PF) 1 % IJ SOLN
5.0000 mL | Freq: Once | INTRAMUSCULAR | Status: AC
  Administered 2024-06-30: 5 mL
  Filled 2024-06-30: qty 5
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
## 2024-06-30 NOTE — Discharge Instructions (Addendum)
 Please follow-up with your primary care provider or return to this ED in 7 to 10 days for suture removal.  Keep sutures dry for first 24 hrs. Keep suture site clean & dry. Gently use soap & water after first 24 hrs. DO NOT USE alcohol, hydrogen peroxide etc, to clean skin. You may cover the incision with clean gauze & replace it after your daily shower for your comfort.  Monitor for symptoms of infection as discussed.

## 2024-07-24 ENCOUNTER — Emergency Department
Admission: EM | Admit: 2024-07-24 | Discharge: 2024-07-24 | Disposition: A | Discharge status: Home / Self Care | Attending: Emergency Medicine | Admitting: Emergency Medicine

## 2024-07-24 ENCOUNTER — Encounter: Payer: Self-pay | Admitting: *Deleted

## 2024-07-24 ENCOUNTER — Other Ambulatory Visit: Payer: Self-pay

## 2024-07-24 DIAGNOSIS — Z3202 Encounter for pregnancy test, result negative: Secondary | ICD-10-CM | POA: Insufficient documentation

## 2024-07-24 LAB — POC URINE PREG, ED: Preg Test, Ur: NEGATIVE

## 2024-07-24 NOTE — ED Provider Notes (Signed)
   [facility] Provider Note    Event Date/Time   First MD Initiated Contact with Patient 07/24/24 1614     (approximate)   History   Possible Pregnancy   HPI  Hannah York is a 23 y.o. female with PMH depression presents for a pregnancy test.  Patient states that her menstrual cycle is 1 week late and she is normally pretty regular.  She took a pregnancy test at home that was negative.  No other complaints.      Physical Exam   Triage Vital Signs: ED Triage Vitals  Encounter Vitals Group     BP 07/24/24 1610 123/80     Girls Systolic BP Percentile --      Girls Diastolic BP Percentile --      Boys Systolic BP Percentile --      Boys Diastolic BP Percentile --      Pulse Rate 07/24/24 1610 72     Resp 07/24/24 1610 18     Temp 07/24/24 1610 98 F (36.7 C)     Temp Source 07/24/24 1610 Oral     SpO2 07/24/24 1610 100 %     Weight 07/24/24 1609 127 lb (57.6 kg)     Height 07/24/24 1609 5' 5 (1.651 m)     Head Circumference --      Peak Flow --      Pain Score 07/24/24 1609 0     Pain Loc --      Pain Education --      Exclude from Growth Chart --     Most recent vital signs: Vitals:   07/24/24 1610  BP: 123/80  Pulse: 72  Resp: 18  Temp: 98 F (36.7 C)  SpO2: 100%   General: Awake, no distress.  CV:  Good peripheral perfusion.  Resp:  Normal effort.  Abd:  No distention.  Other:     ED Results / Procedures / Treatments   Labs (all labs ordered are listed, but only abnormal results are displayed) Labs Reviewed  POC URINE PREG, ED    PROCEDURES:  Critical Care performed: No  Procedures   MEDICATIONS ORDERED IN ED: Medications - No data to display   IMPRESSION / MDM / ASSESSMENT AND PLAN / ED COURSE  I reviewed the triage vital signs and the nursing notes.                             23 year old female presents for pregnancy test.  Vital signs stable patient NAD on exam.  Differential diagnosis includes,  but is not limited to, pregnancy test.  Patient's presentation is most consistent with acute, uncomplicated illness.  Urine pregnancy test is negative.  Patient does not have any other complaints.  Advised her to follow-up with her OB/GYN as needed.  She voiced understanding, questions were answered and she was stable at discharge.      FINAL CLINICAL IMPRESSION(S) / ED DIAGNOSES   Final diagnoses:  Negative pregnancy test     Rx / DC Orders   ED Discharge Orders     None        Note:  This document was prepared using Dragon voice recognition software and may include unintentional dictation errors.   Cleaster Tinnie LABOR, PA-C 07/24/24 1636    Arlander Charleston, MD 07/24/24 [phone]

## 2024-07-24 NOTE — Discharge Instructions (Signed)
Follow-up with your OB/GYN as needed.

## 2024-07-24 NOTE — ED Triage Notes (Signed)
 Pt ambulatory to triage.  Pt reports menses late for 1 week.  Pt took pregnancy test and it was Negative yesterday.  Pt has no other sx. Pt alert

## 2024-08-19 ENCOUNTER — Encounter (HOSPITAL_BASED_OUTPATIENT_CLINIC_OR_DEPARTMENT_OTHER): Payer: Medicaid Other | Admitting: Family Medicine

## 2024-08-30 DIAGNOSIS — Z419 Encounter for procedure for purposes other than remedying health state, unspecified: Secondary | ICD-10-CM | POA: Diagnosis not present
